# Patient Record
Sex: Male | Born: 1970
Health system: Southern US, Community
[De-identification: ages and names within clinical notes are randomized; demographics above are authoritative.]

## PROBLEM LIST (undated history)

## (undated) DIAGNOSIS — I2510 Atherosclerotic heart disease of native coronary artery without angina pectoris: Secondary | ICD-10-CM

## (undated) DIAGNOSIS — I10 Essential (primary) hypertension: Secondary | ICD-10-CM

## (undated) DIAGNOSIS — M549 Dorsalgia, unspecified: Secondary | ICD-10-CM

## (undated) DIAGNOSIS — I219 Acute myocardial infarction, unspecified: Secondary | ICD-10-CM

## (undated) DIAGNOSIS — M1990 Unspecified osteoarthritis, unspecified site: Secondary | ICD-10-CM

## (undated) HISTORY — DX: Atherosclerotic heart disease of native coronary artery without angina pectoris: I25.10

## (undated) HISTORY — PX: ELBOW SURGERY: SHX618

---

## 2002-11-24 ENCOUNTER — Emergency Department (HOSPITAL_COMMUNITY): Admission: AD | Admit: 2002-11-24 | Discharge: 2002-11-24 | Payer: Self-pay | Admitting: Family Medicine

## 2005-05-13 ENCOUNTER — Emergency Department (HOSPITAL_COMMUNITY): Admission: EM | Admit: 2005-05-13 | Discharge: 2005-05-13 | Payer: Self-pay | Admitting: Family Medicine

## 2005-09-25 ENCOUNTER — Emergency Department (HOSPITAL_COMMUNITY): Admission: EM | Admit: 2005-09-25 | Discharge: 2005-09-25 | Payer: Self-pay | Admitting: Family Medicine

## 2005-10-23 ENCOUNTER — Emergency Department (HOSPITAL_COMMUNITY): Admission: EM | Admit: 2005-10-23 | Discharge: 2005-10-23 | Payer: Self-pay | Admitting: Family Medicine

## 2006-01-28 ENCOUNTER — Emergency Department (HOSPITAL_COMMUNITY): Admission: EM | Admit: 2006-01-28 | Discharge: 2006-01-28 | Payer: Self-pay | Admitting: Family Medicine

## 2006-05-29 ENCOUNTER — Ambulatory Visit: Payer: Self-pay | Admitting: Internal Medicine

## 2006-05-29 DIAGNOSIS — K089 Disorder of teeth and supporting structures, unspecified: Secondary | ICD-10-CM | POA: Insufficient documentation

## 2007-03-04 ENCOUNTER — Encounter (INDEPENDENT_AMBULATORY_CARE_PROVIDER_SITE_OTHER): Payer: Self-pay | Admitting: Family Medicine

## 2008-02-20 ENCOUNTER — Emergency Department (HOSPITAL_COMMUNITY): Admission: EM | Admit: 2008-02-20 | Discharge: 2008-02-20 | Payer: Self-pay | Admitting: Family Medicine

## 2010-02-21 ENCOUNTER — Emergency Department (HOSPITAL_COMMUNITY): Payer: Self-pay

## 2010-02-21 ENCOUNTER — Emergency Department (HOSPITAL_COMMUNITY)
Admission: EM | Admit: 2010-02-21 | Discharge: 2010-02-21 | Disposition: A | Discharge status: Home / Self Care | Payer: Self-pay | Attending: Emergency Medicine | Admitting: Emergency Medicine

## 2010-02-21 DIAGNOSIS — K0510 Chronic gingivitis, plaque induced: Secondary | ICD-10-CM | POA: Insufficient documentation

## 2010-02-21 DIAGNOSIS — R509 Fever, unspecified: Secondary | ICD-10-CM | POA: Insufficient documentation

## 2010-02-21 DIAGNOSIS — R05 Cough: Secondary | ICD-10-CM | POA: Insufficient documentation

## 2010-02-21 DIAGNOSIS — F17200 Nicotine dependence, unspecified, uncomplicated: Secondary | ICD-10-CM | POA: Insufficient documentation

## 2010-02-21 DIAGNOSIS — J069 Acute upper respiratory infection, unspecified: Secondary | ICD-10-CM | POA: Insufficient documentation

## 2010-02-21 DIAGNOSIS — R059 Cough, unspecified: Secondary | ICD-10-CM | POA: Insufficient documentation

## 2010-02-21 DIAGNOSIS — K1370 Unspecified lesions of oral mucosa: Secondary | ICD-10-CM | POA: Insufficient documentation

## 2010-10-03 ENCOUNTER — Ambulatory Visit (INDEPENDENT_AMBULATORY_CARE_PROVIDER_SITE_OTHER): Payer: BC Managed Care – PPO

## 2010-10-03 ENCOUNTER — Inpatient Hospital Stay (INDEPENDENT_AMBULATORY_CARE_PROVIDER_SITE_OTHER)
Admission: RE | Admit: 2010-10-03 | Discharge: 2010-10-03 | Disposition: A | Discharge status: Home / Self Care | Payer: BC Managed Care – PPO | Source: Ambulatory Visit | Attending: Family Medicine | Admitting: Family Medicine

## 2010-10-03 DIAGNOSIS — J40 Bronchitis, not specified as acute or chronic: Secondary | ICD-10-CM

## 2010-10-03 DIAGNOSIS — J45909 Unspecified asthma, uncomplicated: Secondary | ICD-10-CM

## 2011-11-19 DIAGNOSIS — M549 Dorsalgia, unspecified: Secondary | ICD-10-CM

## 2011-11-19 HISTORY — DX: Dorsalgia, unspecified: M54.9

## 2011-11-27 ENCOUNTER — Emergency Department (HOSPITAL_BASED_OUTPATIENT_CLINIC_OR_DEPARTMENT_OTHER): Payer: Worker's Compensation

## 2011-11-27 ENCOUNTER — Emergency Department (HOSPITAL_BASED_OUTPATIENT_CLINIC_OR_DEPARTMENT_OTHER)
Admission: EM | Admit: 2011-11-27 | Discharge: 2011-11-27 | Disposition: A | Discharge status: Home / Self Care | Payer: Worker's Compensation | Attending: Emergency Medicine | Admitting: Emergency Medicine

## 2011-11-27 ENCOUNTER — Encounter (HOSPITAL_BASED_OUTPATIENT_CLINIC_OR_DEPARTMENT_OTHER): Payer: Self-pay | Admitting: Emergency Medicine

## 2011-11-27 DIAGNOSIS — W010XXA Fall on same level from slipping, tripping and stumbling without subsequent striking against object, initial encounter: Secondary | ICD-10-CM | POA: Insufficient documentation

## 2011-11-27 DIAGNOSIS — S8000XA Contusion of unspecified knee, initial encounter: Secondary | ICD-10-CM

## 2011-11-27 DIAGNOSIS — F17200 Nicotine dependence, unspecified, uncomplicated: Secondary | ICD-10-CM | POA: Insufficient documentation

## 2011-11-27 DIAGNOSIS — Y9289 Other specified places as the place of occurrence of the external cause: Secondary | ICD-10-CM | POA: Insufficient documentation

## 2011-11-27 DIAGNOSIS — Y9389 Activity, other specified: Secondary | ICD-10-CM | POA: Insufficient documentation

## 2011-11-27 NOTE — ED Notes (Signed)
Pt tripped over a box at work and fell onto a concrete floor onto left knee. Skin intact.  No swelling noted.  Pt ambulatory with limp.
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
## 2011-11-27 NOTE — ED Provider Notes (Signed)
History     CSN: [id number]  Arrival date & time 11/27/11  1919   First MD Initiated Contact with Patient 11/27/11 1942      Chief Complaint  Patient presents with  . Knee Injury  . Fall    (Consider location/radiation/quality/duration/timing/severity/associated sxs/prior treatment) HPI Pt reports he was at work today when he tripped over a box and landed on his L knee, hitting the medial side of his knee on a concrete floor. Complaining of moderate aching pain to knee, worse with movement. No other injuries.   History reviewed. No pertinent past medical history.  Past Surgical History  Procedure Date  . Elbow surgery     No family history on file.  History  Substance Use Topics  . Smoking status: Heavy Tobacco Smoker -- 2.5 packs/day    Types: Cigarettes  . Smokeless tobacco: Not on file  . Alcohol Use: No      Review of Systems All other systems reviewed and are negative except as noted in HPI.   Allergies  Review of patient's allergies indicates no known allergies.  Home Medications  No current outpatient prescriptions on file.  BP 132/86  Pulse 71  Temp 98.5 F (36.9 C) (Oral)  Resp 18  Ht 5\' 7"  (1.702 m)  Wt 137 lb 9 oz (62.398 kg)  BMI 21.55 kg/m2  SpO2 99%  Physical Exam  Nursing note and vitals reviewed. Constitutional: He is oriented to person, place, and time. He appears well-developed and well-nourished.  HENT:  Head: Atraumatic.  Neck: Neck supple.  Cardiovascular: Intact distal pulses.   Pulmonary/Chest: Effort normal.  Musculoskeletal: He exhibits tenderness (tender to medial L knee without joint effusion, ROM limited by pain). He exhibits no edema.  Neurological: He is alert and oriented to person, place, and time. He has normal strength. No cranial nerve deficit or sensory deficit.  Skin: Skin is warm and dry. No rash noted.  Psychiatric: He has a normal mood and affect.    ED Course  Procedures (including critical care  time)  Labs Reviewed - No data to display Dg Knee Complete 4 Views Left  11/27/2011  *RADIOLOGY REPORT*  Clinical Data: Pain post fall.  LEFT KNEE - COMPLETE 4+ VIEW  Comparison: None.  Findings: No effusion. Negative for fracture, dislocation, or other acute abnormality.  Normal alignment and mineralization. No significant degenerative change.  Regional soft tissues unremarkable.  IMPRESSION:  Negative   Original Report Authenticated By: D. Andria Rhein, MD      No diagnosis found.    MDM  Xray neg. Given ACE wrap, advised NSAIDs as needed for pain.        Charles B. Bernette Mayers, MD 11/27/11 2034

## 2011-11-28 ENCOUNTER — Emergency Department (HOSPITAL_BASED_OUTPATIENT_CLINIC_OR_DEPARTMENT_OTHER): Payer: Worker's Compensation

## 2011-11-28 ENCOUNTER — Emergency Department (HOSPITAL_BASED_OUTPATIENT_CLINIC_OR_DEPARTMENT_OTHER)
Admission: EM | Admit: 2011-11-28 | Discharge: 2011-11-28 | Disposition: A | Discharge status: Home / Self Care | Payer: Worker's Compensation | Attending: Emergency Medicine | Admitting: Emergency Medicine

## 2011-11-28 ENCOUNTER — Encounter (HOSPITAL_BASED_OUTPATIENT_CLINIC_OR_DEPARTMENT_OTHER): Payer: Self-pay | Admitting: *Deleted

## 2011-11-28 DIAGNOSIS — M545 Low back pain, unspecified: Secondary | ICD-10-CM | POA: Insufficient documentation

## 2011-11-28 DIAGNOSIS — M549 Dorsalgia, unspecified: Secondary | ICD-10-CM

## 2011-11-28 DIAGNOSIS — F17200 Nicotine dependence, unspecified, uncomplicated: Secondary | ICD-10-CM | POA: Insufficient documentation

## 2011-11-28 MED ORDER — HYDROCODONE-ACETAMINOPHEN 5-325 MG PO TABS: 2.0000 | ORAL_TABLET | ORAL | Status: DC | PRN

## 2011-11-28 MED ORDER — HYDROCODONE-ACETAMINOPHEN 5-325 MG PO TABS
2.0000 | ORAL_TABLET | Freq: Once | ORAL | Status: AC
  Administered 2011-11-28: 2 via ORAL
  Filled 2011-11-28: qty 2

## 2011-11-28 NOTE — ED Notes (Signed)
Back pain. Was here yesterday for same.
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
## 2011-11-28 NOTE — ED Provider Notes (Signed)
Medical screening examination/treatment/procedure(s) were performed by non-physician practitioner and as supervising physician I was immediately available for consultation/collaboration.  Ethelda Chick, MD 11/28/11 [phone]

## 2011-11-28 NOTE — ED Provider Notes (Signed)
History     CSN: [id number]  Arrival date & time 11/28/11  1607   First MD Initiated Contact with Patient 11/28/11 1730      Chief Complaint  Patient presents with  . Back Pain    (Consider location/radiation/quality/duration/timing/severity/associated sxs/prior treatment) Patient is a 41 y.o. male presenting with back pain. The history is provided by the patient. No language interpreter was used.  Back Pain  This is a new problem. The current episode started more than 1 week ago. The problem occurs constantly. The pain is associated with falling. The pain is present in the lumbar spine. The quality of the pain is described as shooting and aching. The pain does not radiate. The pain is at a severity of 5/10. The pain is moderate. The symptoms are aggravated by bending and twisting. The pain is the same all the time. Stiffness is present all day. He has tried nothing for the symptoms. The treatment provided no relief.  Pt complains of pain to his low back.  Pt fell yesterday and was seen here for pain to his knee from fall.  Pt reports back was sore from hitting the concreted and has worsened today  History reviewed. No pertinent past medical history.  Past Surgical History  Procedure Date  . Elbow surgery     No family history on file.  History  Substance Use Topics  . Smoking status: Heavy Tobacco Smoker -- 2.5 packs/day    Types: Cigarettes  . Smokeless tobacco: Not on file  . Alcohol Use: No      Review of Systems  Musculoskeletal: Positive for back pain.  All other systems reviewed and are negative.    Allergies  Review of patient's allergies indicates no known allergies.  Home Medications  No current outpatient prescriptions on file.  BP 142/86  Pulse 99  Temp 98.3 F (36.8 C) (Oral)  Resp 20  SpO2 99%  Physical Exam  Nursing note and vitals reviewed. Constitutional: He is oriented to person, place, and time. He appears well-developed and  well-nourished.  HENT:  Head: Normocephalic and atraumatic.  Right Ear: External ear normal.  Left Ear: External ear normal.  Eyes: Conjunctivae normal and EOM are normal. Pupils are equal, round, and reactive to light.  Neck: Normal range of motion. Neck supple.  Cardiovascular: Normal rate and regular rhythm.   Pulmonary/Chest: Effort normal and breath sounds normal.  Abdominal: Soft. Bowel sounds are normal.  Musculoskeletal: He exhibits tenderness.  Neurological: He is alert and oriented to person, place, and time. He has normal reflexes.  Skin: Skin is warm.  Psychiatric: He has a normal mood and affect.    ED Course  Procedures (including critical care time)  Labs Reviewed - No data to display Dg Knee Complete 4 Views Left  11/27/2011  *RADIOLOGY REPORT*  Clinical Data: Pain post fall.  LEFT KNEE - COMPLETE 4+ VIEW  Comparison: None.  Findings: No effusion. Negative for fracture, dislocation, or other acute abnormality.  Normal alignment and mineralization. No significant degenerative change.  Regional soft tissues unremarkable.  IMPRESSION:  Negative   Original Report Authenticated By: D. Andria Rhein, MD      No diagnosis found.    MDM  Pt given rx for hydrocodone for pain.   I advised pt to follow up with Dr. Pearletha Forge for recheck       Elson Areas, [place] 11/28/11 1859

## 2018-06-21 DIAGNOSIS — M5416 Radiculopathy, lumbar region: Secondary | ICD-10-CM

## 2018-06-21 HISTORY — DX: Radiculopathy, lumbar region: M54.16

## 2018-06-25 ENCOUNTER — Ambulatory Visit (INDEPENDENT_AMBULATORY_CARE_PROVIDER_SITE_OTHER): Payer: BC Managed Care – PPO

## 2018-06-25 ENCOUNTER — Encounter (HOSPITAL_COMMUNITY): Payer: Self-pay | Admitting: Emergency Medicine

## 2018-06-25 ENCOUNTER — Other Ambulatory Visit: Payer: Self-pay

## 2018-06-25 ENCOUNTER — Ambulatory Visit (HOSPITAL_COMMUNITY)
Admission: EM | Admit: 2018-06-25 | Discharge: 2018-06-25 | Disposition: A | Discharge status: Home / Self Care | Payer: BC Managed Care – PPO | Attending: Family Medicine | Admitting: Family Medicine

## 2018-06-25 DIAGNOSIS — S2241XA Multiple fractures of ribs, right side, initial encounter for closed fracture: Secondary | ICD-10-CM | POA: Diagnosis not present

## 2018-06-25 MED ORDER — IBUPROFEN 800 MG PO TABS: 800.0000 mg | ORAL_TABLET | Freq: Three times a day (TID) | ORAL | 0 refills | Status: DC | PRN

## 2018-06-25 MED ORDER — HYDROCODONE-ACETAMINOPHEN 5-325 MG PO TABS: 1.0000 | ORAL_TABLET | Freq: Four times a day (QID) | ORAL | 0 refills | Status: DC | PRN

## 2018-06-25 NOTE — Discharge Instructions (Addendum)
You have 2 broken ribs Take ibuprofen 3 times a day with food Take hydrocodone if needed for severe pain Do not take hydrocodone and drive, or work You need to avoid heavy lifting pushing and pulling until ribs have healed.  Ideally, light duty for another 6 to 8 weeks Try to reduce or quit smoking Take deep breaths and cough several times a day to prevent sputum from building up in your chest Watch for pneumonia.  If you develop increasing cough, fever, body aches, fatigue, or shortness of breath you need to come back for a recheck, or present to the emergency room for care
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
## 2019-06-21 DIAGNOSIS — M25552 Pain in left hip: Secondary | ICD-10-CM | POA: Diagnosis not present

## 2019-06-21 DIAGNOSIS — M5416 Radiculopathy, lumbar region: Secondary | ICD-10-CM | POA: Diagnosis not present

## 2019-06-21 DIAGNOSIS — M25551 Pain in right hip: Secondary | ICD-10-CM | POA: Diagnosis not present

## 2019-06-21 DIAGNOSIS — M545 Low back pain: Secondary | ICD-10-CM | POA: Diagnosis not present

## 2019-06-21 DIAGNOSIS — S3992XA Unspecified injury of lower back, initial encounter: Secondary | ICD-10-CM | POA: Diagnosis not present

## 2019-06-21 DIAGNOSIS — F17200 Nicotine dependence, unspecified, uncomplicated: Secondary | ICD-10-CM | POA: Diagnosis not present

## 2019-06-21 DIAGNOSIS — M5432 Sciatica, left side: Secondary | ICD-10-CM | POA: Diagnosis not present

## 2019-06-30 DIAGNOSIS — M5442 Lumbago with sciatica, left side: Secondary | ICD-10-CM | POA: Diagnosis not present

## 2019-06-30 DIAGNOSIS — I10 Essential (primary) hypertension: Secondary | ICD-10-CM | POA: Diagnosis not present

## 2019-09-17 DIAGNOSIS — M5442 Lumbago with sciatica, left side: Secondary | ICD-10-CM | POA: Diagnosis not present

## 2019-09-17 DIAGNOSIS — M21612 Bunion of left foot: Secondary | ICD-10-CM | POA: Diagnosis not present

## 2019-10-11 ENCOUNTER — Ambulatory Visit: Payer: Self-pay

## 2019-10-11 ENCOUNTER — Ambulatory Visit (INDEPENDENT_AMBULATORY_CARE_PROVIDER_SITE_OTHER): Payer: Self-pay | Admitting: Physician Assistant

## 2019-10-11 ENCOUNTER — Encounter: Payer: Self-pay | Admitting: Orthopedic Surgery

## 2019-10-11 DIAGNOSIS — M79672 Pain in left foot: Secondary | ICD-10-CM

## 2019-10-11 DIAGNOSIS — M5416 Radiculopathy, lumbar region: Secondary | ICD-10-CM

## 2019-10-11 DIAGNOSIS — M545 Low back pain, unspecified: Secondary | ICD-10-CM

## 2019-10-11 HISTORY — DX: Radiculopathy, lumbar region: M54.16

## 2019-10-11 MED ORDER — PREDNISONE 10 MG PO TABS: 20.0000 mg | ORAL_TABLET | Freq: Every day | ORAL | 0 refills | Status: DC
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
## 2019-11-26 ENCOUNTER — Other Ambulatory Visit: Payer: Self-pay

## 2019-11-26 ENCOUNTER — Emergency Department (HOSPITAL_COMMUNITY): Payer: Self-pay

## 2019-11-26 ENCOUNTER — Emergency Department (HOSPITAL_COMMUNITY)
Admission: EM | Admit: 2019-11-26 | Discharge: 2019-11-26 | Disposition: A | Discharge status: Home / Self Care | Payer: Self-pay | Attending: Emergency Medicine | Admitting: Emergency Medicine

## 2019-11-26 ENCOUNTER — Encounter (HOSPITAL_COMMUNITY): Payer: Self-pay | Admitting: Emergency Medicine

## 2019-11-26 DIAGNOSIS — F17210 Nicotine dependence, cigarettes, uncomplicated: Secondary | ICD-10-CM | POA: Insufficient documentation

## 2019-11-26 DIAGNOSIS — M5441 Lumbago with sciatica, right side: Secondary | ICD-10-CM | POA: Insufficient documentation

## 2019-11-26 DIAGNOSIS — M1990 Unspecified osteoarthritis, unspecified site: Secondary | ICD-10-CM

## 2019-11-26 DIAGNOSIS — G8929 Other chronic pain: Secondary | ICD-10-CM | POA: Insufficient documentation

## 2019-11-26 DIAGNOSIS — I10 Essential (primary) hypertension: Secondary | ICD-10-CM

## 2019-11-26 HISTORY — DX: Essential (primary) hypertension: I10

## 2019-11-26 HISTORY — DX: Unspecified osteoarthritis, unspecified site: M19.90

## 2019-11-26 MED ORDER — METHYLPREDNISOLONE 4 MG PO TBPK: ORAL_TABLET | ORAL | 0 refills | Status: DC

## 2019-11-26 NOTE — Discharge Instructions (Addendum)
Please return for any problem.   Follow up with [facility] as instructed.   [facility] - Medical Records [phone].
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
## 2020-05-01 ENCOUNTER — Other Ambulatory Visit: Payer: Self-pay

## 2020-12-19 ENCOUNTER — Ambulatory Visit (HOSPITAL_COMMUNITY)
Admission: EM | Admit: 2020-12-19 | Discharge: 2020-12-19 | Disposition: A | Discharge status: Home / Self Care | Payer: Self-pay | Attending: Physician Assistant | Admitting: Physician Assistant

## 2020-12-19 ENCOUNTER — Encounter (HOSPITAL_COMMUNITY): Payer: Self-pay | Admitting: Emergency Medicine

## 2020-12-19 ENCOUNTER — Other Ambulatory Visit: Payer: Self-pay

## 2020-12-19 DIAGNOSIS — J069 Acute upper respiratory infection, unspecified: Secondary | ICD-10-CM

## 2020-12-19 DIAGNOSIS — R52 Pain, unspecified: Secondary | ICD-10-CM | POA: Insufficient documentation

## 2020-12-19 DIAGNOSIS — R051 Acute cough: Secondary | ICD-10-CM | POA: Insufficient documentation

## 2020-12-19 DIAGNOSIS — Z20822 Contact with and (suspected) exposure to covid-19: Secondary | ICD-10-CM | POA: Insufficient documentation

## 2020-12-19 DIAGNOSIS — R5383 Other fatigue: Secondary | ICD-10-CM | POA: Insufficient documentation

## 2020-12-19 DIAGNOSIS — F17210 Nicotine dependence, cigarettes, uncomplicated: Secondary | ICD-10-CM | POA: Insufficient documentation

## 2020-12-19 LAB — POC INFLUENZA A AND B ANTIGEN (URGENT CARE ONLY)
INFLUENZA A ANTIGEN, POC: NEGATIVE
INFLUENZA B ANTIGEN, POC: NEGATIVE

## 2020-12-19 MED ORDER — PROMETHAZINE-DM 6.25-15 MG/5ML PO SYRP: 5.0000 mL | ORAL_SOLUTION | Freq: Three times a day (TID) | ORAL | 0 refills | Status: DC | PRN

## 2020-12-19 NOTE — ED Provider Notes (Signed)
[facility]    CSN: [id number] Arrival date & time: 12/19/20  0944      History   Chief Complaint Chief Complaint  Patient presents with   Headache   Fatigue   Generalized Body Aches    HPI SENDER RUEB is a 50 y.o. male.   Patient presents today with a 2-day history of URI symptoms including change in sense of taste, headache, fever, body aches, cough, congestion.  Denies any chest pain, shortness of breath, nausea, vomiting, diarrhea.  Reports sick contacts at work that have been diagnosed with COVID-19 as well as flu.  He has had COVID-19 vaccine Linwood Dibbles) but has not had booster.  He has not had flu shot.  He denies any significant past medical history including asthma, allergies, COPD but is a current everyday smoker.  He denies any formal diagnosis of diabetes but does have a family history of this and is interested in being tested at; does not currently have a PCP but is open to establishing with one.  He is having difficulty with daily activities as result of symptoms.   History reviewed. No pertinent past medical history.  Patient Active Problem List   Diagnosis Date Noted   DENTAL PAIN 05/29/2006    Past Surgical History:  Procedure Laterality Date   ELBOW SURGERY         Home Medications    Prior to Admission medications   Medication Sig Start Date End Date Taking? Authorizing Provider  promethazine-dextromethorphan (PROMETHAZINE-DM) 6.25-15 MG/5ML syrup Take 5 mLs by mouth 3 (three) times daily as needed for cough. 12/19/20  Yes Hopie Pellegrin, Noberto Retort, PA-C    Family History History reviewed. No pertinent family history.  Social History Social History   Tobacco Use   Smoking status: Heavy Smoker    Packs/day: 2.50    Types: Cigarettes   Smokeless tobacco: Never  Substance Use Topics   Alcohol use: No   Drug use: No     Allergies   Patient has no known allergies.   Review of Systems Review of Systems  Constitutional:  Positive for  activity change and fatigue. Negative for appetite change and fever.  HENT:  Positive for congestion. Negative for sinus pressure, sneezing and sore throat.   Respiratory:  Positive for cough. Negative for shortness of breath.   Cardiovascular:  Negative for chest pain.  Gastrointestinal:  Negative for abdominal pain, diarrhea, nausea and vomiting.  Musculoskeletal:  Positive for arthralgias and myalgias.  Neurological:  Positive for headaches. Negative for dizziness and light-headedness.    Physical Exam Triage Vital Signs ED Triage Vitals  Enc Vitals Group     BP 12/19/20 1118 (!) 155/90     Pulse Rate 12/19/20 1118 69     Resp 12/19/20 1118 17     Temp 12/19/20 1118 98.3 F (36.8 C)     Temp Source 12/19/20 1118 Oral     SpO2 12/19/20 1118 97 %     Weight --      Height --      Head Circumference --      Peak Flow --      Pain Score 12/19/20 1117 5     Pain Loc --      Pain Edu? --      Excl. in GC? --    No data found.  Updated Vital Signs BP (!) 155/90    Pulse 69    Temp 98.3 F (36.8 C) (Oral)  Resp 17    SpO2 97%   Visual Acuity Right Eye Distance:   Left Eye Distance:   Bilateral Distance:    Right Eye Near:   Left Eye Near:    Bilateral Near:     Physical Exam Vitals reviewed.  Constitutional:      General: He is awake.     Appearance: Normal appearance. He is well-developed. He is not ill-appearing.     Comments: Very pleasant male appears stated age in no acute distress sitting comfortably in exam room  HENT:     Head: Normocephalic and atraumatic.     Right Ear: Tympanic membrane, ear canal and external ear normal. Tympanic membrane is not erythematous or bulging.     Left Ear: Tympanic membrane, ear canal and external ear normal. Tympanic membrane is not erythematous or bulging.     Nose: Nose normal.     Mouth/Throat:     Pharynx: Uvula midline. Posterior oropharyngeal erythema present. No oropharyngeal exudate or uvula swelling.   Cardiovascular:     Rate and Rhythm: Normal rate and regular rhythm.     Heart sounds: Normal heart sounds, S1 normal and S2 normal. No murmur heard. Pulmonary:     Effort: Pulmonary effort is normal. No accessory muscle usage or respiratory distress.     Breath sounds: Normal breath sounds. No stridor. No wheezing, rhonchi or rales.     Comments: Clear to auscultation bilaterally Abdominal:     General: Bowel sounds are normal.     Palpations: Abdomen is soft.     Tenderness: There is no abdominal tenderness.  Neurological:     Mental Status: He is alert.  Psychiatric:        Behavior: Behavior is cooperative.     UC Treatments / Results  Labs (all labs ordered are listed, but only abnormal results are displayed) Labs Reviewed  SARS CORONAVIRUS 2 (TAT 6-24 HRS)  POC INFLUENZA A AND B ANTIGEN (URGENT CARE ONLY)    EKG   Radiology No results found.  Procedures Procedures (including critical care time)  Medications Ordered in UC Medications - No data to display  Initial Impression / Assessment and Plan / UC Course  I have reviewed the triage vital signs and the nursing notes.  Pertinent labs & imaging results that were available during my care of the patient were reviewed by me and considered in my medical decision making (see chart for details).     Discussed likely viral etiology given short duration of symptoms.  Patient is negative for influenza.  COVID test is pending.  He was provided work excuse note with current CDC return to work guidelines based on COVID test results.  No evidence of acute infection that warrant initiation of antibiotics.  He was prescribed Promethazine DM for cough with instruction of drive drink alcohol with taking this medication.  Recommended he use over-the-counter medications including Tylenol and ibuprofen for fever and pain and Mucinex/Flonase for cough and congestion.  He is to rest and drink plenty of fluid.  We will try to establish  him with a primary care provider for follow-up of acute illness as well as chronic medical conditions and preventative health.  Discussed alarm symptoms that warrant emergent evaluation.  Strict return precautions given to which he expressed understanding.  Final Clinical Impressions(s) / UC Diagnoses   Final diagnoses:  Upper respiratory tract infection, unspecified type  Acute cough     Discharge Instructions      I believe  they have a virus.  You tested negative for flu.  We will contact you if your COVID test result is positive.  Please remain in isolation until you receive these results.  Use Promethazine DM up to 3 times a day as needed for cough.  This can make you sleepy do not drive or drink alcohol with taking it.  Alternate Tylenol and ibuprofen for fever and pain.  Use Mucinex and Flonase for cough and congestion.  Make sure you rest and drink plenty of fluid.  If you have any worsening symptoms including high fever, shortness of breath, worsening cough, nausea, vomiting you need to be seen immediately.     ED Prescriptions     Medication Sig Dispense Auth. Provider   promethazine-dextromethorphan (PROMETHAZINE-DM) 6.25-15 MG/5ML syrup Take 5 mLs by mouth 3 (three) times daily as needed for cough. 118 mL Ashton Belote K, PA-C      PDMP not reviewed this encounter.   Jeani Hawking, PA-C 12/19/20 1252

## 2020-12-19 NOTE — ED Triage Notes (Signed)
Pt is present today with loss of taste, HA, fatigue, and body aches. Pt states sx started Sunday

## 2020-12-19 NOTE — Discharge Instructions (Signed)
I believe they have a virus.  You tested negative for flu.  We will contact you if your COVID test result is positive.  Please remain in isolation until you receive these results.  Use Promethazine DM up to 3 times a day as needed for cough.  This can make you sleepy do not drive or drink alcohol with taking it.  Alternate Tylenol and ibuprofen for fever and pain.  Use Mucinex and Flonase for cough and congestion.  Make sure you rest and drink plenty of fluid.  If you have any worsening symptoms including high fever, shortness of breath, worsening cough, nausea, vomiting you need to be seen immediately.

## 2020-12-20 LAB — SARS CORONAVIRUS 2 (TAT 6-24 HRS): SARS Coronavirus 2: NEGATIVE

## 2021-02-08 ENCOUNTER — Ambulatory Visit (HOSPITAL_COMMUNITY)
Admission: EM | Admit: 2021-02-08 | Discharge: 2021-02-08 | Disposition: A | Discharge status: Home / Self Care | Payer: Self-pay | Attending: Student | Admitting: Student

## 2021-02-08 ENCOUNTER — Encounter (HOSPITAL_COMMUNITY): Payer: Self-pay

## 2021-02-08 ENCOUNTER — Other Ambulatory Visit: Payer: Self-pay

## 2021-02-08 DIAGNOSIS — S46811A Strain of other muscles, fascia and tendons at shoulder and upper arm level, right arm, initial encounter: Secondary | ICD-10-CM

## 2021-02-08 MED ORDER — TIZANIDINE HCL 2 MG PO TABS: 2.0000 mg | ORAL_TABLET | Freq: Three times a day (TID) | ORAL | 0 refills | Status: DC | PRN
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
## 2021-02-08 NOTE — Discharge Instructions (Addendum)
-  Start the muscle relaxer-Zanaflex (tizanidine), up to 3 times daily for muscle spasms and pain.  This can make you drowsy, so take at bedtime or when you do not need to drive or operate machinery. -Heating pad  
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
## 2021-02-20 ENCOUNTER — Ambulatory Visit (INDEPENDENT_AMBULATORY_CARE_PROVIDER_SITE_OTHER): Payer: Self-pay | Admitting: Orthopaedic Surgery

## 2021-02-20 ENCOUNTER — Other Ambulatory Visit: Payer: Self-pay

## 2021-02-20 ENCOUNTER — Encounter: Payer: Self-pay | Admitting: Orthopaedic Surgery

## 2021-02-20 ENCOUNTER — Ambulatory Visit (INDEPENDENT_AMBULATORY_CARE_PROVIDER_SITE_OTHER): Payer: Self-pay

## 2021-02-20 VITALS — Ht 67.0 in | Wt 160.0 lb

## 2021-02-20 DIAGNOSIS — G8929 Other chronic pain: Secondary | ICD-10-CM

## 2021-02-20 DIAGNOSIS — M545 Low back pain, unspecified: Secondary | ICD-10-CM

## 2021-02-20 MED ORDER — PREDNISONE 10 MG (21) PO TBPK: ORAL_TABLET | ORAL | 21 refills | Status: DC

## 2021-02-20 MED ORDER — DICLOFENAC SODIUM 75 MG PO TBEC: 75.0000 mg | DELAYED_RELEASE_TABLET | Freq: Two times a day (BID) | ORAL | 2 refills | Status: DC | PRN
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
## 2021-02-21 ENCOUNTER — Telehealth: Payer: Self-pay | Admitting: Physical Medicine and Rehabilitation

## 2021-02-21 NOTE — Telephone Encounter (Signed)
Patient called needing a call back to schedule an appointment with Dr. Alvester Morin for his back. 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 He will have to let his job know his appointment so he can take off work.   [phone]

## 2021-03-01 ENCOUNTER — Telehealth: Payer: Self-pay | Admitting: Physical Medicine and Rehabilitation
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
## 2021-03-01 NOTE — Telephone Encounter (Signed)
Patient called. Would like an appointment with Dr. Newton  

## 2021-03-02 ENCOUNTER — Ambulatory Visit: Payer: Self-pay | Attending: Physician Assistant

## 2021-03-02 ENCOUNTER — Other Ambulatory Visit: Payer: Self-pay

## 2021-03-02 DIAGNOSIS — G8929 Other chronic pain: Secondary | ICD-10-CM | POA: Insufficient documentation

## 2021-03-02 DIAGNOSIS — M545 Low back pain, unspecified: Secondary | ICD-10-CM | POA: Insufficient documentation

## 2021-03-09 ENCOUNTER — Other Ambulatory Visit: Payer: Self-pay

## 2021-03-09 ENCOUNTER — Ambulatory Visit: Payer: BC Managed Care – PPO | Attending: Physician Assistant

## 2021-03-09 DIAGNOSIS — G8929 Other chronic pain: Secondary | ICD-10-CM | POA: Diagnosis present

## 2021-03-09 DIAGNOSIS — M545 Low back pain, unspecified: Secondary | ICD-10-CM | POA: Insufficient documentation
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
## 2021-03-12 ENCOUNTER — Telehealth: Payer: Self-pay | Admitting: Physical Medicine and Rehabilitation

## 2021-03-12 NOTE — Telephone Encounter (Signed)
Pt returned call to [facility] to set an appt. Please call pt at [phone] ?

## 2021-03-23 ENCOUNTER — Ambulatory Visit: Payer: BC Managed Care – PPO

## 2021-03-23 ENCOUNTER — Other Ambulatory Visit: Payer: Self-pay

## 2021-03-23 DIAGNOSIS — M545 Low back pain, unspecified: Secondary | ICD-10-CM
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
## 2021-03-27 ENCOUNTER — Ambulatory Visit (INDEPENDENT_AMBULATORY_CARE_PROVIDER_SITE_OTHER): Payer: BC Managed Care – PPO | Admitting: Physical Medicine and Rehabilitation

## 2021-03-27 ENCOUNTER — Ambulatory Visit: Payer: Self-pay

## 2021-03-27 ENCOUNTER — Encounter: Payer: Self-pay | Admitting: Physical Medicine and Rehabilitation

## 2021-03-27 ENCOUNTER — Other Ambulatory Visit: Payer: Self-pay

## 2021-03-27 VITALS — BP 138/80 | HR 71

## 2021-03-27 DIAGNOSIS — M5416 Radiculopathy, lumbar region: Secondary | ICD-10-CM

## 2021-03-27 MED ORDER — METHYLPREDNISOLONE ACETATE 80 MG/ML IJ SUSP
80.0000 mg | Freq: Once | INTRAMUSCULAR | Status: AC
  Administered 2021-03-27: 80 mg

## 2021-03-27 NOTE — Progress Notes (Signed)
Patient presents today for L5/S1 interlam ESI. He states that pain is 0/10 however he is still having continued tingling pains in his feet. At this time OTC tylenol is being used for pain prn. ?

## 2021-03-27 NOTE — Patient Instructions (Signed)

## 2021-03-30 ENCOUNTER — Ambulatory Visit: Payer: BC Managed Care – PPO

## 2021-03-30 ENCOUNTER — Other Ambulatory Visit: Payer: Self-pay

## 2021-03-30 DIAGNOSIS — M545 Low back pain, unspecified: Secondary | ICD-10-CM | POA: Diagnosis not present
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
## 2021-04-06 NOTE — Procedures (Signed)
Lumbar Epidural Steroid Injection - Interlaminar Approach with Fluoroscopic Guidance ? ?[name]      ?Date of Birth: September 04, 1970 ?MRN: [id number] ?PCP: Patient, No Pcp Per (Inactive)      ?Visit Date: 03/27/2021 ?  ?Universal Protocol:    ? ?Consent Given By: the patient ? ?Position: PRONE ? ?Additional Comments: ?Vital signs were monitored before and after the procedure. ?Patient was prepped and draped in the usual sterile fashion. ?The correct patient, procedure, and site was verified. ? ? ?Injection Procedure Details:  ? ?Procedure diagnoses: Lumbar radiculopathy [M54.16]  ? ?Meds Administered:  ?Meds ordered this encounter  ?Medications  ? methylPREDNISolone acetate (DEPO-MEDROL) injection 80 mg  ?  ? ?Laterality: Left ? ?Location/Site:  L5-S1 ? ?Needle: 3.5 in., 20 ga. Tuohy ? ?Needle Placement: Paramedian epidural ? ?Findings:  ? -Comments: Excellent flow of contrast into the epidural space. ? ?Procedure Details: ?Using a paramedian approach from the side mentioned above, the region overlying the inferior lamina was localized under fluoroscopic visualization and the soft tissues overlying this structure were infiltrated with 4 ml. of 1% Lidocaine without Epinephrine. The Tuohy needle was inserted into the epidural space using a paramedian approach.  ? ?The epidural space was localized using loss of resistance along with counter oblique bi-planar fluoroscopic views.  After negative aspirate for air, blood, and CSF, a 2 ml. volume of Isovue-250 was injected into the epidural space and the flow of contrast was observed. Radiographs were obtained for documentation purposes.   ? ?The injectate was administered into the level noted above. ? ? ?Additional Comments:  ?The patient tolerated the procedure well ?Dressing: 2 x 2 sterile gauze and Band-Aid ?  ? ?Post-procedure details: ?Patient was observed during the procedure. ?Post-procedure instructions were reviewed. ? ?Patient left the clinic in stable  condition. ?

## 2021-04-06 NOTE — Progress Notes (Signed)
? ?Wayne Huff - 51 y.o. male MRN [id number]  Date of birth: February 12, 1970 ? ?Office Visit Note: ?Visit Date: 03/27/2021 ?PCP: Patient, No Pcp Per (Inactive) ?Referred by: Tarry Kos, MD ? ?Subjective: ?Chief Complaint  ?Patient presents with  ? Lower Back - Follow-up  ? ?HPI:  Wayne Huff is a 51 y.o. male who comes in today at the request of Dr. Glee Arvin for planned Left L5-S1 Lumbar Interlaminar epidural steroid injection with fluoroscopic guidance.  The patient has failed conservative care including home exercise, medications, time and activity modification.  This injection will be diagnostic and hopefully therapeutic.  Please see requesting physician notes for further details and justification. ? ?ROS Otherwise per HPI. ? ?Assessment & Plan: ?Visit Diagnoses:  ?  ICD-10-CM   ?1. Lumbar radiculopathy  M54.16 XR C-ARM NO REPORT  ?  Epidural Steroid injection  ?  methylPREDNISolone acetate (DEPO-MEDROL) injection 80 mg  ?  ?  ?Plan: No additional findings.  ? ?Meds & Orders:  ?Meds ordered this encounter  ?Medications  ? methylPREDNISolone acetate (DEPO-MEDROL) injection 80 mg  ?  ?Orders Placed This Encounter  ?Procedures  ? XR C-ARM NO REPORT  ? Epidural Steroid injection  ?  ?Follow-up: Return for visit to requesting provider as needed.  ? ?Procedures: ?No procedures performed  ?Lumbar Epidural Steroid Injection - Interlaminar Approach with Fluoroscopic Guidance ? ?[name]      ?Date of Birth: [birth year] ?MRN: [id number] ?PCP: Patient, No Pcp Per (Inactive)      ?Visit Date: 03/27/2021 ?  ?Universal Protocol:    ? ?Consent Given By: the patient ? ?Position: PRONE ? ?Additional Comments: ?Vital signs were monitored before and after the procedure. ?Patient was prepped and draped in the usual sterile fashion. ?The correct patient, procedure, and site was verified. ? ? ?Injection Procedure Details:  ? ?Procedure diagnoses: Lumbar radiculopathy [M54.16]  ? ?Meds Administered:  ?Meds ordered this  encounter  ?Medications  ? methylPREDNISolone acetate (DEPO-MEDROL) injection 80 mg  ?  ? ?Laterality: Left ? ?Location/Site:  L5-S1 ? ?Needle: 3.5 in., 20 ga. Tuohy ? ?Needle Placement: Paramedian epidural ? ?Findings:  ? -Comments: Excellent flow of contrast into the epidural space. ? ?Procedure Details: ?Using a paramedian approach from the side mentioned above, the region overlying the inferior lamina was localized under fluoroscopic visualization and the soft tissues overlying this structure were infiltrated with 4 ml. of 1% Lidocaine without Epinephrine. The Tuohy needle was inserted into the epidural space using a paramedian approach.  ? ?The epidural space was localized using loss of resistance along with counter oblique bi-planar fluoroscopic views.  After negative aspirate for air, blood, and CSF, a 2 ml. volume of Isovue-250 was injected into the epidural space and the flow of contrast was observed. Radiographs were obtained for documentation purposes.   ? ?The injectate was administered into the level noted above. ? ? ?Additional Comments:  ?The patient tolerated the procedure well ?Dressing: 2 x 2 sterile gauze and Band-Aid ?  ? ?Post-procedure details: ?Patient was observed during the procedure. ?Post-procedure instructions were reviewed. ? ?Patient left the clinic in stable condition.  ? ?Clinical History: ?MRI LUMBAR SPINE WITHOUT CONTRAST  ?   ?TECHNIQUE:  ?Multiplanar, multisequence MR imaging of the lumbar spine was  ?performed. No intravenous contrast was administered.  ?   ?COMPARISON:  Lumbar spine radiographs 10/11/2019  ?   ?FINDINGS:  ?Segmentation:  Standard.  ?   ?Alignment:  Normal.  ?   ?  Vertebrae: No acute fracture, suspicious osseous lesion, or  ?significant marrow edema. Possible left-sided L5 pars defect without  ?significant listhesis.  ?   ?Conus medullaris and cauda equina: Conus extends to the T12-L1  ?level. Conus and cauda equina appear normal.  ?   ?Paraspinal and other soft  tissues: Unremarkable.  ?   ?Disc levels:  ?   ?T12-L1 and L1-2: Negative.  ?   ?L2-3: Minimal disc bulging and mild facet and ligamentum flavum  ?hypertrophy without stenosis.  ?   ?L3-4: Minimal disc bulging and mild facet and ligamentum flavum  ?hypertrophy without stenosis.  ?   ?L4-5: Disc desiccation and slight disc space narrowing. Mild disc  ?bulging, a small right foraminal disc protrusion, moderate  ?ligamentum flavum hypertrophy, and moderate to severe right and mild  ?left facet hypertrophy result in mild spinal stenosis and mild right  ?neural foraminal stenosis.  ?   ?L5-S1: Minimal disc bulging and mild facet hypertrophy without  ?stenosis.  ?   ?IMPRESSION:  ?Mild lumbar disc degeneration, greatest at L4-5 where advanced  ?posterior element hypertrophy contributes to mild spinal and right  ?neural foraminal stenosis.  ?   ?   ?Electronically Signed  ?  By: Sebastian Ache M.D.  ?  On: 11/26/2019 09:58  ? ? ? ?Objective:  VS:  HT:    WT:   BMI:     BP:138/80  HR:71bpm  TEMP: ( )  RESP:97 % ?Physical Exam ?Vitals and nursing note reviewed.  ?Constitutional:   ?   General: He is not in acute distress. ?   Appearance: Normal appearance. He is not ill-appearing.  ?HENT:  ?   Head: Normocephalic and atraumatic.  ?   Right Ear: External ear normal.  ?   Left Ear: External ear normal.  ?   Nose: No congestion.  ?Eyes:  ?   Extraocular Movements: Extraocular movements intact.  ?Cardiovascular:  ?   Rate and Rhythm: Normal rate.  ?   Pulses: Normal pulses.  ?Pulmonary:  ?   Effort: Pulmonary effort is normal. No respiratory distress.  ?Abdominal:  ?   General: There is no distension.  ?   Palpations: Abdomen is soft.  ?Musculoskeletal:     ?   General: No tenderness or signs of injury.  ?   Cervical back: Neck supple.  ?   Right lower leg: No edema.  ?   Left lower leg: No edema.  ?   Comments: Patient has good distal strength without clonus.  ?Skin: ?   Findings: No erythema or rash.  ?Neurological:  ?    General: No focal deficit present.  ?   Mental Status: He is alert and oriented to person, place, and time.  ?   Sensory: No sensory deficit.  ?   Motor: No weakness or abnormal muscle tone.  ?   Coordination: Coordination normal.  ?Psychiatric:     ?   Mood and Affect: Mood normal.     ?   Behavior: Behavior normal.  ?  ? ?Imaging: ?No results found. ?

## 2021-04-12 IMAGING — DX RIGHT RIBS AND CHEST - 3+ VIEW
3 series · 3 of 3 positions shown · non-contrast
Comparison: Chest x-ray dated 10/03/2010.

CLINICAL DATA: Fall, RIGHT-sided pain. Cough.

EXAM:
RIGHT RIBS AND CHEST - 3+ VIEW

[chest pa]
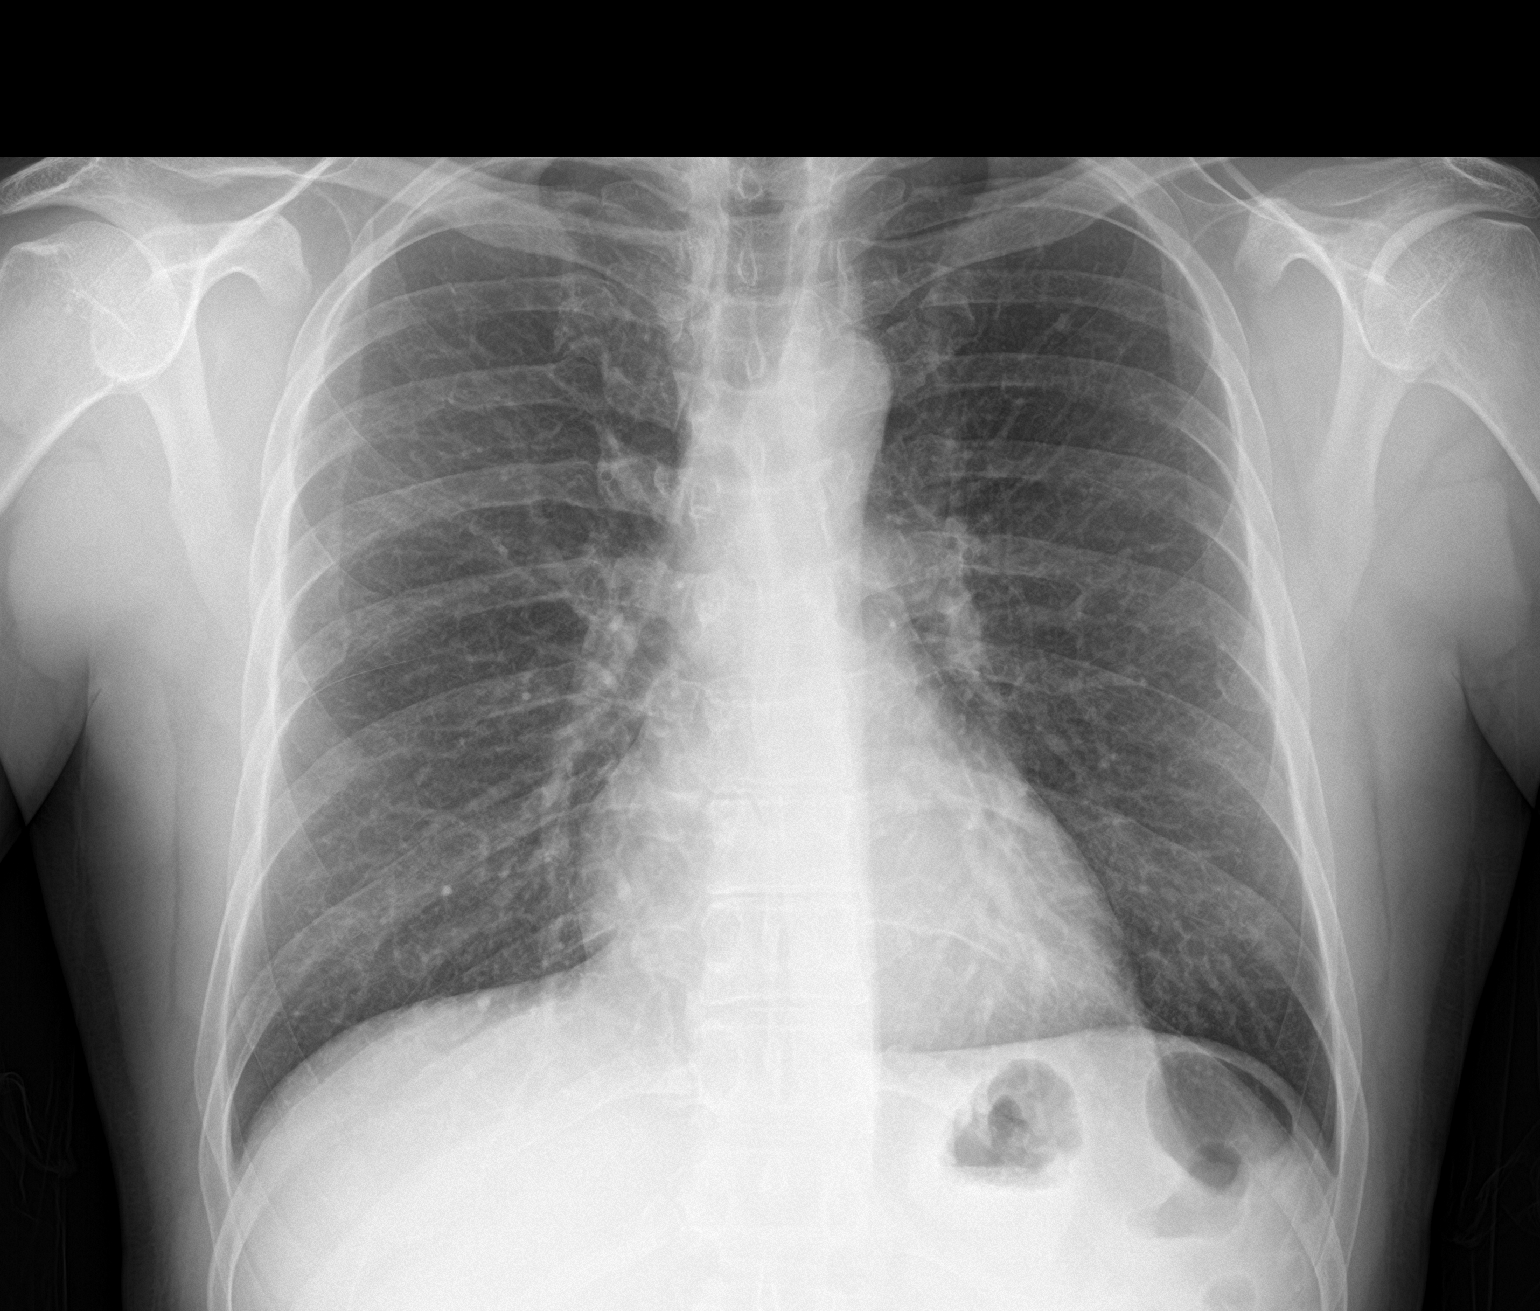

[rib pa]
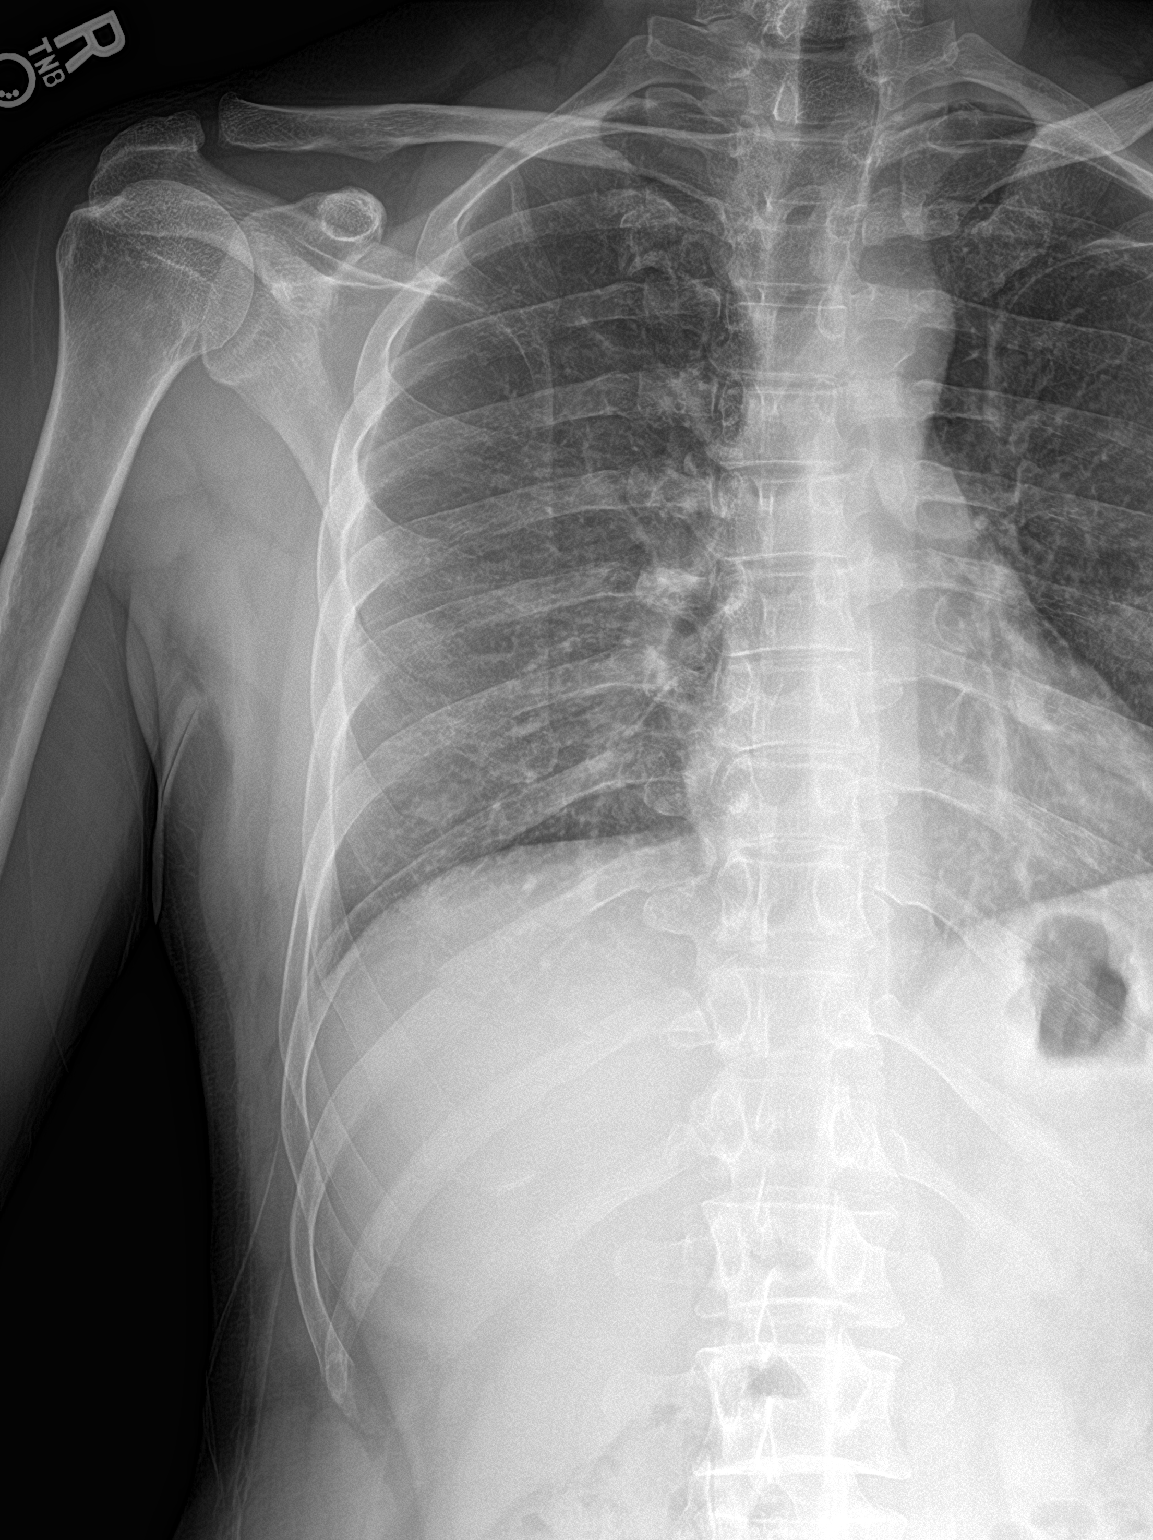

[rib obl]
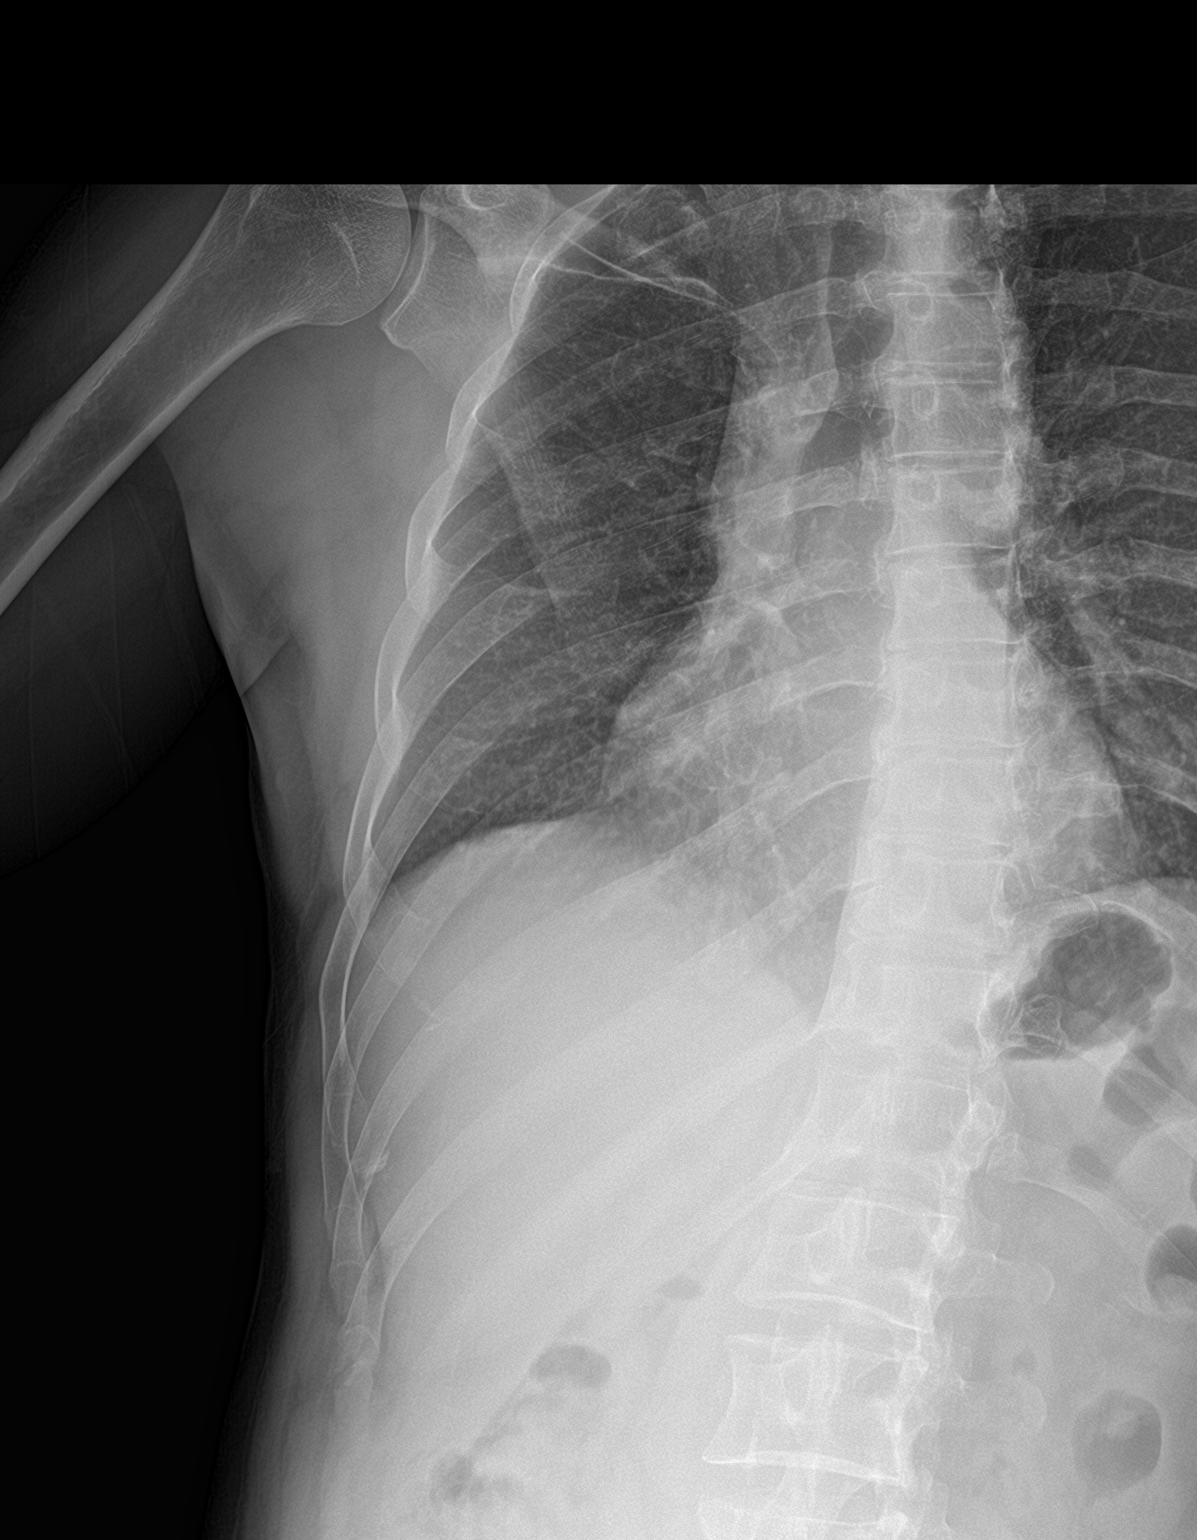

[3 of 3 positions shown; findings below may reference images not displayed]

FINDINGS: Heart size and mediastinal contours are within normal limits. Lungs
are clear. No pleural effusion or pneumothorax seen. Slightly
displaced fractures of the RIGHT posterior ninth and tenth ribs.
IMPRESSION: 1. Slightly displaced fractures of the RIGHT posterior ninth and
tenth ribs.
2. Lungs are clear. No pleural effusion or pneumothorax seen.

## 2022-03-30 ENCOUNTER — Encounter (HOSPITAL_COMMUNITY): Payer: Self-pay

## 2022-03-30 ENCOUNTER — Ambulatory Visit (HOSPITAL_COMMUNITY)
Admission: EM | Admit: 2022-03-30 | Discharge: 2022-03-30 | Disposition: A | Discharge status: Home / Self Care | Payer: BC Managed Care – PPO | Attending: Physician Assistant | Admitting: Physician Assistant

## 2022-03-30 DIAGNOSIS — M5416 Radiculopathy, lumbar region: Secondary | ICD-10-CM | POA: Diagnosis not present

## 2022-03-30 DIAGNOSIS — I10 Essential (primary) hypertension: Secondary | ICD-10-CM

## 2022-03-30 MED ORDER — TIZANIDINE HCL 2 MG PO TABS: 2.0000 mg | ORAL_TABLET | Freq: Three times a day (TID) | ORAL | 0 refills | Status: DC | PRN

## 2022-03-30 MED ORDER — AMLODIPINE BESYLATE 5 MG PO TABS: 5.0000 mg | ORAL_TABLET | Freq: Every day | ORAL | 0 refills | Status: DC

## 2022-03-30 MED ORDER — METHYLPREDNISOLONE SODIUM SUCC 125 MG IJ SOLR
80.0000 mg | Freq: Once | INTRAMUSCULAR | Status: AC
  Administered 2022-03-30: 80 mg via INTRAMUSCULAR

## 2022-03-30 MED ORDER — METHYLPREDNISOLONE SODIUM SUCC 125 MG IJ SOLR
INTRAMUSCULAR | Status: AC
  Filled 2022-03-30: qty 2

## 2022-03-30 MED ORDER — TRAMADOL HCL 50 MG PO TABS: 50.0000 mg | ORAL_TABLET | Freq: Three times a day (TID) | ORAL | 0 refills | Status: AC
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
## 2022-03-30 NOTE — ED Triage Notes (Signed)
Pt presents to the office for lower back pain. Pt stated this is ongoing issue for him.

## 2022-03-30 NOTE — Discharge Instructions (Signed)
Good to meet you today.  Please follow-up with Dr. Ernestina Patches, you will need to call his office for an appointment.  You also need to establish with primary care for ongoing preventive care and likely referral to pain management.  For acute worsening symptoms, please present to the emergency department.  You were given a steroid injection at the urgent care today, which should help with some of the symptoms.  You can take tramadol 3 times daily to help with moderate to severe pain.  Do not drive or operate heavy machinery with this medication.  Zanaflex to be taken as directed to help with muscle spasms.  Your blood pressure is also elevated.  I have started you on amlodipine 5 mg once daily.  Watch for swelling in the legs.  Again, you will need to follow-up with primary care to help monitor your blood pressure readings.

## 2022-04-01 ENCOUNTER — Telehealth: Payer: Self-pay | Admitting: Physical Medicine and Rehabilitation

## 2022-04-01 NOTE — Telephone Encounter (Signed)
Patient requesting an appointment with Dr. Newton 

## 2022-04-01 NOTE — Telephone Encounter (Signed)
Spoke with patient and scheduled an OV for 04/02/22. He was seen in urgent care and they told him to follow up here

## 2022-04-02 ENCOUNTER — Ambulatory Visit (INDEPENDENT_AMBULATORY_CARE_PROVIDER_SITE_OTHER): Payer: BC Managed Care – PPO | Admitting: Physical Medicine and Rehabilitation

## 2022-04-02 ENCOUNTER — Encounter: Payer: Self-pay | Admitting: Physical Medicine and Rehabilitation

## 2022-04-02 DIAGNOSIS — M5416 Radiculopathy, lumbar region: Secondary | ICD-10-CM

## 2022-04-02 DIAGNOSIS — M5441 Lumbago with sciatica, right side: Secondary | ICD-10-CM

## 2022-04-02 DIAGNOSIS — G8929 Other chronic pain: Secondary | ICD-10-CM

## 2022-04-02 DIAGNOSIS — M5442 Lumbago with sciatica, left side: Secondary | ICD-10-CM | POA: Diagnosis not present

## 2022-04-02 DIAGNOSIS — M47816 Spondylosis without myelopathy or radiculopathy, lumbar region: Secondary | ICD-10-CM
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
## 2022-05-03 LAB — HM COLONOSCOPY

## 2022-11-23 ENCOUNTER — Inpatient Hospital Stay (HOSPITAL_COMMUNITY)
Admission: EM | Disposition: A | Discharge status: Home / Self Care | Payer: Self-pay | Source: Home / Self Care | Attending: Cardiology

## 2022-11-23 ENCOUNTER — Emergency Department (HOSPITAL_COMMUNITY): Admit: 2022-11-23 | Payer: Medicaid Other | Admitting: Cardiology

## 2022-11-23 ENCOUNTER — Inpatient Hospital Stay (HOSPITAL_COMMUNITY)
Admission: EM | Admit: 2022-11-23 | Discharge: 2022-11-26 | DRG: 322 | Disposition: A | Discharge status: Home / Self Care | Payer: Medicaid Other | Attending: Cardiology | Admitting: Cardiology

## 2022-11-23 DIAGNOSIS — G8929 Other chronic pain: Secondary | ICD-10-CM | POA: Diagnosis present

## 2022-11-23 DIAGNOSIS — F17210 Nicotine dependence, cigarettes, uncomplicated: Secondary | ICD-10-CM | POA: Diagnosis present

## 2022-11-23 DIAGNOSIS — Z8249 Family history of ischemic heart disease and other diseases of the circulatory system: Secondary | ICD-10-CM | POA: Diagnosis not present

## 2022-11-23 DIAGNOSIS — I25118 Atherosclerotic heart disease of native coronary artery with other forms of angina pectoris: Secondary | ICD-10-CM | POA: Diagnosis not present

## 2022-11-23 DIAGNOSIS — Z823 Family history of stroke: Secondary | ICD-10-CM

## 2022-11-23 DIAGNOSIS — Z955 Presence of coronary angioplasty implant and graft: Secondary | ICD-10-CM

## 2022-11-23 DIAGNOSIS — I110 Hypertensive heart disease with heart failure: Secondary | ICD-10-CM | POA: Diagnosis present

## 2022-11-23 DIAGNOSIS — I219 Acute myocardial infarction, unspecified: Secondary | ICD-10-CM

## 2022-11-23 DIAGNOSIS — I2510 Atherosclerotic heart disease of native coronary artery without angina pectoris: Secondary | ICD-10-CM

## 2022-11-23 DIAGNOSIS — I255 Ischemic cardiomyopathy: Secondary | ICD-10-CM | POA: Diagnosis present

## 2022-11-23 DIAGNOSIS — Z79899 Other long term (current) drug therapy: Secondary | ICD-10-CM

## 2022-11-23 DIAGNOSIS — Z7982 Long term (current) use of aspirin: Secondary | ICD-10-CM

## 2022-11-23 DIAGNOSIS — Z8679 Personal history of other diseases of the circulatory system: Secondary | ICD-10-CM | POA: Insufficient documentation

## 2022-11-23 DIAGNOSIS — E785 Hyperlipidemia, unspecified: Secondary | ICD-10-CM

## 2022-11-23 DIAGNOSIS — I10 Essential (primary) hypertension: Secondary | ICD-10-CM | POA: Diagnosis present

## 2022-11-23 DIAGNOSIS — Z5971 Insufficient health insurance coverage: Secondary | ICD-10-CM | POA: Diagnosis not present

## 2022-11-23 DIAGNOSIS — M549 Dorsalgia, unspecified: Secondary | ICD-10-CM | POA: Diagnosis present

## 2022-11-23 DIAGNOSIS — I2111 ST elevation (STEMI) myocardial infarction involving right coronary artery: Principal | ICD-10-CM | POA: Diagnosis present

## 2022-11-23 DIAGNOSIS — Z7902 Long term (current) use of antithrombotics/antiplatelets: Secondary | ICD-10-CM | POA: Diagnosis not present

## 2022-11-23 DIAGNOSIS — I2119 ST elevation (STEMI) myocardial infarction involving other coronary artery of inferior wall: Principal | ICD-10-CM | POA: Diagnosis present

## 2022-11-23 DIAGNOSIS — I5021 Acute systolic (congestive) heart failure: Secondary | ICD-10-CM | POA: Insufficient documentation

## 2022-11-23 HISTORY — DX: Hyperlipidemia, unspecified: E78.5

## 2022-11-23 HISTORY — DX: Dorsalgia, unspecified: M54.9

## 2022-11-23 HISTORY — PX: LEFT HEART CATH AND CORONARY ANGIOGRAPHY: CATH118249

## 2022-11-23 HISTORY — PX: CORONARY/GRAFT ACUTE MI REVASCULARIZATION: CATH118305

## 2022-11-23 HISTORY — DX: Acute myocardial infarction, unspecified: I21.9

## 2022-11-23 HISTORY — DX: Unspecified osteoarthritis, unspecified site: M19.90

## 2022-11-23 LAB — COMPREHENSIVE METABOLIC PANEL
ALT: 36 U/L (ref 0–44)
AST: 24 U/L (ref 15–41)
Albumin: 3.2 g/dL — ABNORMAL LOW (ref 3.5–5.0)
Alkaline Phosphatase: 56 U/L (ref 38–126)
Anion gap: 10 (ref 5–15)
BUN: 11 mg/dL (ref 6–20)
CO2: 23 mmol/L (ref 22–32)
Calcium: 8.5 mg/dL — ABNORMAL LOW (ref 8.9–10.3)
Chloride: 101 mmol/L (ref 98–111)
Creatinine, Ser: 1.08 mg/dL (ref 0.61–1.24)
GFR, Estimated: >60 mL/min (ref >60)
Glucose, Bld: 184 mg/dL — ABNORMAL HIGH (ref 70–99)
Potassium: 3.4 mmol/L — ABNORMAL LOW (ref 3.5–5.1)
Sodium: 134 mmol/L — ABNORMAL LOW (ref 135–145)
Total Bilirubin: 0.9 mg/dL (ref <1.2)
Total Protein: 6.7 g/dL (ref 6.5–8.1)

## 2022-11-23 LAB — LIPID PANEL
Cholesterol: 181 mg/dL (ref 0–200)
HDL: 40 mg/dL — ABNORMAL LOW (ref >40)
LDL Cholesterol: 121 mg/dL — ABNORMAL HIGH (ref 0–99)
Total CHOL/HDL Ratio: 4.5 {ratio}
Triglycerides: 99 mg/dL (ref <150)
VLDL: 20 mg/dL (ref 0–40)

## 2022-11-23 LAB — POCT ACTIVATED CLOTTING TIME
Activated Clotting Time: 118 s
Activated Clotting Time: 262 s
Activated Clotting Time: 320 s

## 2022-11-23 LAB — PROTIME-INR
INR: 1.1 (ref 0.8–1.2)
Prothrombin Time: 14.2 s (ref 11.4–15.2)

## 2022-11-23 LAB — POCT I-STAT, CHEM 8
BUN: 10 mg/dL (ref 6–20)
Calcium, Ion: 1.18 mmol/L (ref 1.15–1.40)
Chloride: 100 mmol/L (ref 98–111)
Creatinine, Ser: 1.1 mg/dL (ref 0.61–1.24)
Glucose, Bld: 182 mg/dL — ABNORMAL HIGH (ref 70–99)
HCT: 39 % (ref 39.0–52.0)
Hemoglobin: 13.3 g/dL (ref 13.0–17.0)
Potassium: 3.4 mmol/L — ABNORMAL LOW (ref 3.5–5.1)
Sodium: 136 mmol/L (ref 135–145)
TCO2: 24 mmol/L (ref 22–32)

## 2022-11-23 LAB — CBC
HCT: 38.5 % — ABNORMAL LOW (ref 39.0–52.0)
Hemoglobin: 13.4 g/dL (ref 13.0–17.0)
MCH: 33 pg (ref 26.0–34.0)
MCHC: 34.8 g/dL (ref 30.0–36.0)
MCV: 94.8 fL (ref 80.0–100.0)
Platelets: 262 10*3/uL (ref 150–400)
RBC: 4.06 MIL/uL — ABNORMAL LOW (ref 4.22–5.81)
RDW: 12.7 % (ref 11.5–15.5)
WBC: 10.4 10*3/uL (ref 4.0–10.5)
nRBC: 0 % (ref 0.0–0.2)

## 2022-11-23 LAB — TROPONIN I (HIGH SENSITIVITY): Troponin I (High Sensitivity): 168 ng/L (ref <18)

## 2022-11-23 LAB — GLUCOSE, CAPILLARY: Glucose-Capillary: 164 mg/dL — ABNORMAL HIGH (ref 70–99)

## 2022-11-23 LAB — CG4 I-STAT (LACTIC ACID): Lactic Acid, Venous: 0.8 mmol/L (ref 0.5–1.9)

## 2022-11-23 LAB — HEMOGLOBIN A1C
Hgb A1c MFr Bld: 6.3 % — ABNORMAL HIGH (ref 4.8–5.6)
Mean Plasma Glucose: 134.11 mg/dL

## 2022-11-23 LAB — APTT: aPTT: 29 s (ref 24–36)

## 2022-11-23 SURGERY — CORONARY/GRAFT ACUTE MI REVASCULARIZATION
Anesthesia: LOCAL

## 2022-11-23 MED ORDER — ACETAMINOPHEN 325 MG PO TABS: 650.0000 mg | ORAL_TABLET | ORAL | Status: DC | PRN

## 2022-11-23 MED ORDER — ATORVASTATIN CALCIUM 80 MG PO TABS
80.0000 mg | ORAL_TABLET | Freq: Every day | ORAL | Status: DC
  Administered 2022-11-24 – 2022-11-26 (×3): 80 mg via ORAL
  Filled 2022-11-23 (×3): qty 1

## 2022-11-23 MED ORDER — LABETALOL HCL 5 MG/ML IV SOLN
10.0000 mg | INTRAVENOUS | Status: AC | PRN
Start: 2022-11-23 — End: 2022-11-24
  Administered 2022-11-23: 10 mg via INTRAVENOUS
  Filled 2022-11-23: qty 4

## 2022-11-23 MED ORDER — LIDOCAINE HCL (PF) 1 % IJ SOLN
INTRAMUSCULAR | Status: AC
  Filled 2022-11-23: qty 30

## 2022-11-23 MED ORDER — ONDANSETRON HCL 4 MG/2ML IJ SOLN
INTRAMUSCULAR | Status: DC | PRN
  Administered 2022-11-23: 4 mg via INTRAVENOUS

## 2022-11-23 MED ORDER — MIDAZOLAM HCL 2 MG/2ML IJ SOLN
INTRAMUSCULAR | Status: DC | PRN
  Administered 2022-11-23: 1 mg via INTRAVENOUS

## 2022-11-23 MED ORDER — MORPHINE SULFATE (PF) 2 MG/ML IV SOLN: 2.0000 mg | INTRAVENOUS | Status: DC | PRN

## 2022-11-23 MED ORDER — VERAPAMIL HCL 2.5 MG/ML IV SOLN
INTRAVENOUS | Status: DC | PRN
  Administered 2022-11-23: 10 mL via INTRA_ARTERIAL

## 2022-11-23 MED ORDER — SODIUM CHLORIDE 0.9 % IV SOLN
INTRAVENOUS | Status: AC | PRN
  Administered 2022-11-23: 10 mL/h via INTRAVENOUS

## 2022-11-23 MED ORDER — PHENYLEPHRINE 80 MCG/ML (10ML) SYRINGE FOR IV PUSH (FOR BLOOD PRESSURE SUPPORT)
PREFILLED_SYRINGE | INTRAVENOUS | Status: DC | PRN
  Administered 2022-11-23: 240 ug via INTRAVENOUS

## 2022-11-23 MED ORDER — VERAPAMIL HCL 2.5 MG/ML IV SOLN
INTRAVENOUS | Status: AC
  Filled 2022-11-23: qty 2

## 2022-11-23 MED ORDER — FENTANYL CITRATE (PF) 100 MCG/2ML IJ SOLN
INTRAMUSCULAR | Status: DC | PRN
  Administered 2022-11-23: 50 ug via INTRAVENOUS

## 2022-11-23 MED ORDER — ASPIRIN 81 MG PO CHEW
81.0000 mg | CHEWABLE_TABLET | Freq: Every day | ORAL | Status: DC
  Administered 2022-11-24 – 2022-11-26 (×3): 81 mg via ORAL
  Filled 2022-11-23 (×3): qty 1

## 2022-11-23 MED ORDER — TICAGRELOR 90 MG PO TABS
ORAL_TABLET | ORAL | Status: DC | PRN
  Administered 2022-11-23: 180 mg via ORAL

## 2022-11-23 MED ORDER — SODIUM CHLORIDE 0.9 % IV SOLN: INTRAVENOUS | Status: AC

## 2022-11-23 MED ORDER — ORAL CARE MOUTH RINSE: 15.0000 mL | OROMUCOSAL | Status: DC | PRN

## 2022-11-23 MED ORDER — FENTANYL CITRATE (PF) 100 MCG/2ML IJ SOLN
INTRAMUSCULAR | Status: AC
  Filled 2022-11-23: qty 2

## 2022-11-23 MED ORDER — HEPARIN SODIUM (PORCINE) 1000 UNIT/ML IJ SOLN
INTRAMUSCULAR | Status: DC | PRN
  Administered 2022-11-23: 8000 [IU] via INTRAVENOUS
  Administered 2022-11-23: 3000 [IU] via INTRAVENOUS

## 2022-11-23 MED ORDER — SODIUM CHLORIDE 0.9% FLUSH
3.0000 mL | INTRAVENOUS | Status: DC | PRN
Start: 2022-11-24 — End: 2022-11-26

## 2022-11-23 MED ORDER — IOHEXOL 350 MG/ML SOLN
INTRAVENOUS | Status: DC | PRN
  Administered 2022-11-23: 85 mL

## 2022-11-23 MED ORDER — PHENYLEPHRINE 80 MCG/ML (10ML) SYRINGE FOR IV PUSH (FOR BLOOD PRESSURE SUPPORT): 80.0000 ug | PREFILLED_SYRINGE | Freq: Once | INTRAVENOUS | Status: DC | PRN

## 2022-11-23 MED ORDER — SODIUM CHLORIDE 0.9% FLUSH
3.0000 mL | Freq: Two times a day (BID) | INTRAVENOUS | Status: DC
  Administered 2022-11-24 – 2022-11-25 (×3): 3 mL via INTRAVENOUS

## 2022-11-23 MED ORDER — AMLODIPINE BESYLATE 5 MG PO TABS
5.0000 mg | ORAL_TABLET | Freq: Every day | ORAL | Status: DC
  Administered 2022-11-24 – 2022-11-25 (×2): 5 mg via ORAL
  Filled 2022-11-23 (×2): qty 1

## 2022-11-23 MED ORDER — TICAGRELOR 90 MG PO TABS
90.0000 mg | ORAL_TABLET | Freq: Two times a day (BID) | ORAL | Status: DC
  Administered 2022-11-24 – 2022-11-26 (×5): 90 mg via ORAL
  Filled 2022-11-23 (×5): qty 1

## 2022-11-23 MED ORDER — TICAGRELOR 90 MG PO TABS
ORAL_TABLET | ORAL | Status: AC
  Filled 2022-11-23: qty 2

## 2022-11-23 MED ORDER — MIDAZOLAM HCL 2 MG/2ML IJ SOLN
INTRAMUSCULAR | Status: AC
  Filled 2022-11-23: qty 2

## 2022-11-23 MED ORDER — LIDOCAINE HCL (PF) 1 % IJ SOLN
INTRAMUSCULAR | Status: DC | PRN
  Administered 2022-11-23: 2 mL

## 2022-11-23 MED ORDER — HEPARIN SODIUM (PORCINE) 1000 UNIT/ML IJ SOLN
INTRAMUSCULAR | Status: AC
  Filled 2022-11-23: qty 10

## 2022-11-23 MED ORDER — HEPARIN (PORCINE) IN NACL 1000-0.9 UT/500ML-% IV SOLN
INTRAVENOUS | Status: DC | PRN
  Administered 2022-11-23: 500 mL

## 2022-11-23 MED ORDER — ONDANSETRON HCL 4 MG/2ML IJ SOLN
INTRAMUSCULAR | Status: AC
  Filled 2022-11-23: qty 2

## 2022-11-23 MED ORDER — PHENYLEPHRINE HCL-NACL 20-0.9 MG/250ML-% IV SOLN: INTRAVENOUS | Status: DC | PRN

## 2022-11-23 MED ORDER — TIZANIDINE HCL 2 MG PO TABS: 2.0000 mg | ORAL_TABLET | Freq: Three times a day (TID) | ORAL | Status: DC | PRN

## 2022-11-23 MED ORDER — HYDRALAZINE HCL 20 MG/ML IJ SOLN
10.0000 mg | INTRAMUSCULAR | Status: AC | PRN
Start: 2022-11-23 — End: 2022-11-24

## 2022-11-23 MED ORDER — SODIUM CHLORIDE 0.9 % IV SOLN
250.0000 mL | INTRAVENOUS | Status: DC | PRN
Start: 2022-11-24 — End: 2022-11-25

## 2022-11-23 SURGICAL SUPPLY — 18 items
BALLN SAPPHIRE 2.5X12 (BALLOONS) ×1
BALLOON SAPPHIRE 2.5X12 (BALLOONS) IMPLANT
CATH INFINITI 5 FR JL3.5 (CATHETERS) IMPLANT
CATH INFINITI AMBI 5FR TG (CATHETERS) IMPLANT
CATH VISTA GUIDE 6FR JR4 (CATHETERS) IMPLANT
DEVICE RAD COMP TR BAND LRG (VASCULAR PRODUCTS) IMPLANT
GLIDESHEATH SLEND SS 6F .021 (SHEATH) IMPLANT
GUIDEWIRE INQWIRE 1.5J.035X260 (WIRE) IMPLANT
INQWIRE 1.5J .035X260CM (WIRE) ×1
KIT ENCORE 26 ADVANTAGE (KITS) IMPLANT
KIT ESSENTIALS PG (KITS) IMPLANT
KIT SYRINGE INJ CVI SPIKEX1 (MISCELLANEOUS) IMPLANT
PACK CARDIAC CATHETERIZATION (CUSTOM PROCEDURE TRAY) ×1 IMPLANT
SET ATX-X65L (MISCELLANEOUS) IMPLANT
SHEATH PROBE COVER 6X72 (BAG) IMPLANT
STENT ONYX FRONTIER 2.75X30 (Permanent Stent) IMPLANT
TUBING CIL FLEX 10 FLL-RA (TUBING) IMPLANT
WIRE ASAHI PROWATER 180CM (WIRE) IMPLANT
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
## 2022-11-24 ENCOUNTER — Other Ambulatory Visit: Payer: Self-pay

## 2022-11-24 ENCOUNTER — Encounter (HOSPITAL_COMMUNITY): Payer: Self-pay | Admitting: Cardiology

## 2022-11-24 DIAGNOSIS — I2119 ST elevation (STEMI) myocardial infarction involving other coronary artery of inferior wall: Secondary | ICD-10-CM

## 2022-11-24 DIAGNOSIS — E119 Type 2 diabetes mellitus without complications: Secondary | ICD-10-CM

## 2022-11-24 HISTORY — DX: Type 2 diabetes mellitus without complications: E11.9

## 2022-11-24 LAB — TROPONIN I (HIGH SENSITIVITY): Troponin I (High Sensitivity): 23463 ng/L (ref <18)

## 2022-11-24 LAB — CBC
HCT: 39.2 % (ref 39.0–52.0)
Hemoglobin: 13.2 g/dL (ref 13.0–17.0)
MCH: 32.6 pg (ref 26.0–34.0)
MCHC: 33.7 g/dL (ref 30.0–36.0)
MCV: 96.8 fL (ref 80.0–100.0)
Platelets: 256 10*3/uL (ref 150–400)
RBC: 4.05 MIL/uL — ABNORMAL LOW (ref 4.22–5.81)
RDW: 12.8 % (ref 11.5–15.5)
WBC: 6.9 10*3/uL (ref 4.0–10.5)
nRBC: 0 % (ref 0.0–0.2)

## 2022-11-24 LAB — HEMOGLOBIN A1C
Hgb A1c MFr Bld: 6.5 % — ABNORMAL HIGH (ref 4.8–5.6)
Mean Plasma Glucose: 139.85 mg/dL

## 2022-11-24 LAB — LIPID PANEL
Cholesterol: 169 mg/dL (ref 0–200)
HDL: 41 mg/dL (ref >40)
LDL Cholesterol: 113 mg/dL — ABNORMAL HIGH (ref 0–99)
Total CHOL/HDL Ratio: 4.1 {ratio}
Triglycerides: 77 mg/dL (ref <150)
VLDL: 15 mg/dL (ref 0–40)

## 2022-11-24 LAB — BASIC METABOLIC PANEL
Anion gap: 9 (ref 5–15)
BUN: 8 mg/dL (ref 6–20)
CO2: 23 mmol/L (ref 22–32)
Calcium: 8.5 mg/dL — ABNORMAL LOW (ref 8.9–10.3)
Chloride: 104 mmol/L (ref 98–111)
Creatinine, Ser: 1.3 mg/dL — ABNORMAL HIGH (ref 0.61–1.24)
GFR, Estimated: >60 mL/min (ref >60)
Glucose, Bld: 155 mg/dL — ABNORMAL HIGH (ref 70–99)
Potassium: 4.2 mmol/L (ref 3.5–5.1)
Sodium: 136 mmol/L (ref 135–145)

## 2022-11-24 LAB — TSH: TSH: 1.196 u[IU]/mL (ref 0.350–4.500)

## 2022-11-24 LAB — MRSA NEXT GEN BY PCR, NASAL: MRSA by PCR Next Gen: NOT DETECTED

## 2022-11-24 MED ORDER — CHLORHEXIDINE GLUCONATE CLOTH 2 % EX PADS
6.0000 | MEDICATED_PAD | Freq: Every day | CUTANEOUS | Status: DC
  Administered 2022-11-24 – 2022-11-26 (×3): 6 via TOPICAL

## 2022-11-24 NOTE — Plan of Care (Signed)

## 2022-11-24 NOTE — Progress Notes (Signed)
   Rounding Note    Patient Name: Wayne Huff Date of Encounter: 11/24/2022  [facility] Cardiologist: None   Subjective   NAEO No CP.  Vital Signs    Vitals:   11/24/22 0300 11/24/22 0400 11/24/22 0500 11/24/22 0600  BP: 98/65 107/71 123/78 114/73  Pulse:      Resp: 11 11 12 11   Temp:  98.2 F (36.8 C)    TempSrc:  Oral    SpO2: 96% 97% 97% 97%  Weight:      Height:        Intake/Output Summary (Last 24 hours) at 11/24/2022 0830 Last data filed at 11/24/2022 0600 Gross per 24 hour  Intake 600.43 ml  Output 650 ml  Net -49.57 ml      11/23/2022    9:30 PM 11/23/2022    8:40 PM 02/20/2021    1:52 PM  Last 3 Weights  Weight (lbs) 184 lb 8.4 oz 185 lb 160 lb  Weight (kg) 83.7 kg 83.915 kg 72.576 kg      Telemetry    Personally Reviewed  ECG     Personally Reviewed  Physical Exam   GEN: No acute distress.   Cardiac: RRR, no murmurs, rubs, or gallops.  Respiratory: Clear to auscultation bilaterally. Psych: Normal affect   Assessment & Plan    #STEMI #CAD Feeling well this AM. He is hemodynamically/electrically stable this AM.  Planning for staged PCI prior to discharge. Cont ASA/ticagrelor Cont statin  #HTN Cont amlodipine  Keep NPO after MN.   Sheria Lang T. Lalla Brothers, MD, [facility], [facility] Cardiac Electrophysiology

## 2022-11-25 ENCOUNTER — Other Ambulatory Visit (HOSPITAL_COMMUNITY): Payer: Medicaid Other

## 2022-11-25 ENCOUNTER — Other Ambulatory Visit (HOSPITAL_COMMUNITY): Payer: Self-pay

## 2022-11-25 ENCOUNTER — Encounter (HOSPITAL_COMMUNITY): Payer: Self-pay | Admitting: Cardiology

## 2022-11-25 ENCOUNTER — Encounter (HOSPITAL_COMMUNITY)
Admission: EM | Disposition: A | Discharge status: Home / Self Care | Payer: Self-pay | Source: Home / Self Care | Attending: Cardiology

## 2022-11-25 DIAGNOSIS — I25118 Atherosclerotic heart disease of native coronary artery with other forms of angina pectoris: Secondary | ICD-10-CM | POA: Diagnosis not present

## 2022-11-25 HISTORY — PX: CORONARY STENT INTERVENTION: CATH118234

## 2022-11-25 LAB — POCT ACTIVATED CLOTTING TIME
Activated Clotting Time: 314 s
Activated Clotting Time: 417 s

## 2022-11-25 SURGERY — CORONARY STENT INTERVENTION
Anesthesia: LOCAL

## 2022-11-25 MED ORDER — FENTANYL CITRATE (PF) 100 MCG/2ML IJ SOLN
INTRAMUSCULAR | Status: AC
  Filled 2022-11-25: qty 2

## 2022-11-25 MED ORDER — SODIUM CHLORIDE 0.9% FLUSH
3.0000 mL | Freq: Two times a day (BID) | INTRAVENOUS | Status: DC
  Administered 2022-11-25 – 2022-11-26 (×2): 3 mL via INTRAVENOUS

## 2022-11-25 MED ORDER — HEPARIN SODIUM (PORCINE) 1000 UNIT/ML IJ SOLN
INTRAMUSCULAR | Status: AC
Start: 2022-11-25 — End: ?
  Filled 2022-11-25: qty 10

## 2022-11-25 MED ORDER — MIDAZOLAM HCL 2 MG/2ML IJ SOLN
INTRAMUSCULAR | Status: DC | PRN
  Administered 2022-11-25: 1 mg via INTRAVENOUS

## 2022-11-25 MED ORDER — LABETALOL HCL 5 MG/ML IV SOLN: 10.0000 mg | INTRAVENOUS | Status: AC | PRN

## 2022-11-25 MED ORDER — IOHEXOL 350 MG/ML SOLN
INTRAVENOUS | Status: DC | PRN
  Administered 2022-11-25: 60 mL

## 2022-11-25 MED ORDER — SODIUM CHLORIDE 0.9% FLUSH: 3.0000 mL | Freq: Two times a day (BID) | INTRAVENOUS | Status: DC

## 2022-11-25 MED ORDER — LIDOCAINE HCL (PF) 1 % IJ SOLN
INTRAMUSCULAR | Status: AC
  Filled 2022-11-25: qty 30

## 2022-11-25 MED ORDER — ASPIRIN 81 MG PO CHEW: 81.0000 mg | CHEWABLE_TABLET | ORAL | Status: DC

## 2022-11-25 MED ORDER — HEPARIN (PORCINE) IN NACL 1000-0.9 UT/500ML-% IV SOLN
INTRAVENOUS | Status: DC | PRN
  Administered 2022-11-25 (×2): 500 mL

## 2022-11-25 MED ORDER — MIDAZOLAM HCL 2 MG/2ML IJ SOLN
INTRAMUSCULAR | Status: AC
Start: 2022-11-25 — End: ?
  Filled 2022-11-25: qty 2

## 2022-11-25 MED ORDER — SODIUM CHLORIDE 0.9 % IV SOLN: 250.0000 mL | INTRAVENOUS | Status: DC | PRN

## 2022-11-25 MED ORDER — SODIUM CHLORIDE 0.9 % IV SOLN: INTRAVENOUS | Status: DC

## 2022-11-25 MED ORDER — ONDANSETRON HCL 4 MG/2ML IJ SOLN: 4.0000 mg | Freq: Four times a day (QID) | INTRAMUSCULAR | Status: DC | PRN

## 2022-11-25 MED ORDER — VERAPAMIL HCL 2.5 MG/ML IV SOLN
INTRAVENOUS | Status: AC
  Filled 2022-11-25: qty 2

## 2022-11-25 MED ORDER — SODIUM CHLORIDE 0.9% FLUSH: 3.0000 mL | INTRAVENOUS | Status: DC | PRN

## 2022-11-25 MED ORDER — HEPARIN SODIUM (PORCINE) 1000 UNIT/ML IJ SOLN
INTRAMUSCULAR | Status: DC | PRN
  Administered 2022-11-25: 7000 [IU] via INTRAVENOUS

## 2022-11-25 MED ORDER — SODIUM CHLORIDE 0.9 % IV SOLN: INTRAVENOUS | Status: AC

## 2022-11-25 MED ORDER — NITROGLYCERIN 1 MG/10 ML FOR IR/CATH LAB
INTRA_ARTERIAL | Status: AC
  Filled 2022-11-25: qty 10

## 2022-11-25 MED ORDER — HYDRALAZINE HCL 20 MG/ML IJ SOLN: 10.0000 mg | INTRAMUSCULAR | Status: AC | PRN

## 2022-11-25 MED ORDER — LIDOCAINE HCL (PF) 1 % IJ SOLN
INTRAMUSCULAR | Status: DC | PRN
  Administered 2022-11-25: 2 mL via INTRADERMAL

## 2022-11-25 MED ORDER — FENTANYL CITRATE (PF) 100 MCG/2ML IJ SOLN
INTRAMUSCULAR | Status: DC | PRN
  Administered 2022-11-25: 25 ug via INTRAVENOUS

## 2022-11-25 MED ORDER — VERAPAMIL HCL 2.5 MG/ML IV SOLN
INTRAVENOUS | Status: DC | PRN
  Administered 2022-11-25: 10 mL via INTRA_ARTERIAL

## 2022-11-25 SURGICAL SUPPLY — 19 items
BALLN EMERGE MR 2.0X12 (BALLOONS) ×1
BALLN SAPPHIRE 2.5X12 (BALLOONS) ×1
BALLOON EMERGE MR 2.0X12 (BALLOONS) IMPLANT
BALLOON SAPPHIRE 2.5X12 (BALLOONS) IMPLANT
CATH LAUNCHER 6FR EBU3.5 (CATHETERS) IMPLANT
DEVICE RAD COMP TR BAND LRG (VASCULAR PRODUCTS) IMPLANT
ELECT DEFIB PAD ADLT CADENCE (PAD) IMPLANT
GLIDESHEATH SLEND SS 6F .021 (SHEATH) IMPLANT
GUIDEWIRE INQWIRE 1.5J.035X260 (WIRE) IMPLANT
INQWIRE 1.5J .035X260CM (WIRE) ×1
KIT ENCORE 26 ADVANTAGE (KITS) IMPLANT
KIT HEMO VALVE WATCHDOG (MISCELLANEOUS) IMPLANT
PACK CARDIAC CATHETERIZATION (CUSTOM PROCEDURE TRAY) ×1 IMPLANT
SET ATX-X65L (MISCELLANEOUS) IMPLANT
SHEATH PROBE COVER 6X72 (BAG) IMPLANT
STENT SYNERGY XD 2.25X28 (Permanent Stent) IMPLANT
SYNERGY XD 2.25X28 (Permanent Stent) ×1 IMPLANT
TUBING CIL FLEX 10 FLL-RA (TUBING) IMPLANT
WIRE ASAHI PROWATER 180CM (WIRE) IMPLANT

## 2022-11-25 NOTE — Progress Notes (Signed)
Transition of Care [facility]) - Inpatient Brief Assessment   Patient Details  Name: NORTON CLEMMENS MRN: [id number] Date of Birth: July 27, 1970  Transition of Care [facility]) CM/SW Contact:    Ronny Bacon, RN Phone Number: 11/25/2022, 3:45 PM   Clinical Narrative: Patient with medicaid insurance, no PCP listed on chart. Pt should have one assigned through [facility] office. Pt had PCI done today. May need food resources.   Transition of Care Asessment: Insurance and Status: (P) Insurance coverage has been reviewed Patient has primary care physician: (P) No (Pt has medicaid insurance and should have a PCP assigned to him) Home environment has been reviewed: (P) Home     Social Determinants of Health Reivew: (P) SDOH reviewed needs interventions (May need food resources) Readmission risk has been reviewed: (P) Yes Transition of care needs: (P) transition of care needs identified, TOC will continue to follow

## 2022-11-25 NOTE — Progress Notes (Addendum)
CARDIAC REHAB PHASE I   PRE:  Rate/Rhythm: 82 NSR  BP:  Sitting: 134/74      SpO2: 98 RA  MODE:  Ambulation: 370 ft    POST:  Rate/Rhythm: 94 NSR  BP:  Sitting: 160/99      SpO2: 98 RA  Pt ambulated with supervision assist, pt walked well w/o symptoms. Pt educated with famly present.  Pt was educated on STEMI, stent card, stent location, Antiplatelet and ASA use, wt restrictions, no baths/daily wash-ups, s/s of infection, ex guidelines, s/s to stop exercising, NTG use and calling 911, heart healthy diet, risk factors and CRPII. Pt received MI book and materials on exercise, diet, and CRPII. Will refer to [facility].   Encouraged a more active lifestyle and healthier eating to lower A1C, pt is interested in [place].     Faustino Congress  MS, ACSM-CEP 9:21 AM 11/25/2022    Service time is from 0826 to 0921.

## 2022-11-25 NOTE — Interval H&P Note (Signed)
History and Physical Interval Note:  11/25/2022 12:26 PM  HAWKEYE PINE  has presented today for surgery, with the diagnosis of cad.  The various methods of treatment have been discussed with the patient and family. After consideration of risks, benefits and other options for treatment, the patient has consented to  Procedure(s): CORONARY STENT INTERVENTION (N/A) as a surgical intervention.  The patient's history has been reviewed, patient examined, no change in status, stable for surgery.  I have reviewed the patient's chart and labs.  Questions were answered to the patient's satisfaction.     Bryan Lemma

## 2022-11-25 NOTE — Progress Notes (Signed)
Rounding Note    Patient Name: Wayne Huff Date of Encounter: 11/25/2022  [facility] Cardiologist: None [place]  Subjective   No chest pain. Several episodes of dyspnea.   Inpatient Medications    Scheduled Meds:  amLODipine  5 mg Oral Daily   aspirin  81 mg Oral Daily   atorvastatin  80 mg Oral Daily   Chlorhexidine Gluconate Cloth  6 each Topical Daily   sodium chloride flush  3 mL Intravenous Q12H   ticagrelor  90 mg Oral BID   Continuous Infusions:  PRN Meds: acetaminophen, morphine injection, mouth rinse, phenylephrine, sodium chloride flush, tiZANidine   Vital Signs    Vitals:   11/25/22 0500 11/25/22 0600 11/25/22 0700 11/25/22 0800  BP: 124/85  139/80 134/74  Pulse:    65  Resp: 14  13 (!) 8  Temp:      TempSrc:      SpO2: 93%  95% 95%  Weight:  80.9 kg    Height:        Intake/Output Summary (Last 24 hours) at 11/25/2022 0835 Last data filed at 11/24/2022 2000 Gross per 24 hour  Intake 480 ml  Output --  Net 480 ml      11/25/2022    6:00 AM 11/23/2022    9:30 PM 11/23/2022    8:40 PM  Last 3 Weights  Weight (lbs) 178 lb 5.6 oz 184 lb 8.4 oz 185 lb  Weight (kg) 80.9 kg 83.7 kg 83.915 kg      Telemetry    Sinus rhythm, short runs of NSVT - Personally Reviewed  ECG    No am tracing - Personally Reviewed  Physical Exam   GEN: No acute distress.   Neck: No JVD Cardiac: RRR, no murmurs, rubs, or gallops.  Respiratory: Clear to auscultation bilaterally. GI: Soft, nontender, non-distended  MS: No edema; No deformity. Neuro:  Nonfocal  Psych: Normal affect   Labs    High Sensitivity Troponin:   Recent Labs  Lab 11/23/22 2025 11/24/22 0722  TROPONINIHS 168* 23,463*     Chemistry Recent Labs  Lab 11/23/22 2025 11/23/22 2031 11/24/22 0722  NA 134* 136 136  K 3.4* 3.4* 4.2  CL 101 100 104  CO2 23  --  23  GLUCOSE 184* 182* 155*  BUN 11 10 8   CREATININE 1.08 1.10 1.30*  CALCIUM 8.5*  --  8.5*  PROT 6.7   --   --   ALBUMIN 3.2*  --   --   AST 24  --   --   ALT 36  --   --   ALKPHOS 56  --   --   BILITOT 0.9  --   --   GFRNONAA >60  --  >60  ANIONGAP 10  --  9    Lipids  Recent Labs  Lab 11/24/22 0722  CHOL 169  TRIG 77  HDL 41  LDLCALC 113*  CHOLHDL 4.1    Hematology Recent Labs  Lab 11/23/22 2025 11/23/22 2031 11/24/22 0722  WBC 10.4  --  6.9  RBC 4.06*  --  4.05*  HGB 13.4 13.3 13.2  HCT 38.5* 39.0 39.2  MCV 94.8  --  96.8  MCH 33.0  --  32.6  MCHC 34.8  --  33.7  RDW 12.7  --  12.8  PLT 262  --  256   Thyroid  Recent Labs  Lab 11/24/22 0722  TSH 1.196    BNPNo results  for input(s): "BNP", "PROBNP" in the last 168 hours.  DDimer No results for input(s): "DDIMER" in the last 168 hours.   Radiology    CARDIAC CATHETERIZATION  Result Date: 11/23/2022   CULPRIT LESION Segment: Prox RCA-1 lesion is 60% stenosed.  Prox RCA-2 lesion is 100% stenosed.  Prox RCA to Mid RCA lesion is 90% stenosed.   A drug-eluting stent was successfully placed covering all 3 lesions, using a STENT ONYX FRONTIER 2.75X30 -> deployed to 2.9 mm.  Post intervention, there is a 0% residual stenosis.   Mid RCA to Dist RCA lesion is 40% stenosed.   ---------------------------------------------------------------   2nd Mrg lesion is 80% stenosed. ->  Consider staged PCI   Prox LAD to Mid LAD lesion is 30% stenosed.   ---------------------------------------------------------------   There is mild left ventricular systolic dysfunction. The left ventricular ejection fraction is 45-50% by visual estimate. LV end diastolic pressure is moderately elevated.   --------------------------------------------------------------. POST-CATH DIAGNOSES Severe two-vessel disease: Culprit lesion is 100% thrombotic occlusion of the RCA between sidebranches; TIMI 0 flow Successful DES PCI of proximal to mid RCA with Synergy XD; TIMI-3 flow restored Proximal 2nd Mrg focal 80% Proximal LAD 30% at SP1 Mildly reduced LVEF with  apparent inferior hypokinesis. EF 45 to 50%. LVEDP 24 mmHg. RECOMMENDATIONS  In the absence of any other complications or medical issues, we expect the patient to be ready for discharge from an interventional cardiology perspective on 11/25/2022.   Will ask to interventional colleagues to review films, consider staged PCI of 2nd Mrg prior to discharge (could be discharged same day if done early enough). Check 2D echo   Recommend uninterrupted dual antiplatelet therapy with Aspirin 81mg  daily and Ticagrelor 90mg  twice daily for a minimum of 12 months (ACS-Class I recommendation).   Continue Thienopyridine SAPT monotherapy for second year.  (Okay to interrupt at that time) High-dose statin-80 mg atorvastatin Consider reinitiating antihypertensives-beta-blocker/ARB once blood pressure stabilizes in the morning. Bryan Lemma, MD   Cardiac Studies     Patient Profile     52 y.o. male with history of HTN admitted 11/23/22 with an acute inferior STEMI secondary to occlusion of the proximal RCA which was treated with a drug eluting stent. Also with severe obtuse marginal stenosis with plans for staged PCI.   Assessment & Plan    CAD/Inferior STEMI: Doing well this am following PCI of the RCA which was the culprit vessel. Troponin over 23,000. Plans for staged PCI of the OM lesion today. He is chest pain free. Will continue DAPT with ASA/Brilinta. Continue statin. Echo pending today.   For questions or updates, please contact [facility] Please consult [email or web address] for contact info under        Signed, Verne Carrow, MD  11/25/2022, 8:35 AM

## 2022-11-25 NOTE — H&P (View-Only) (Signed)
Rounding Note    Patient Name: Wayne Huff Date of Encounter: 11/25/2022  [facility] Cardiologist: None [place]  Subjective   No chest pain. Several episodes of dyspnea.   Inpatient Medications    Scheduled Meds:  amLODipine  5 mg Oral Daily   aspirin  81 mg Oral Daily   atorvastatin  80 mg Oral Daily   Chlorhexidine Gluconate Cloth  6 each Topical Daily   sodium chloride flush  3 mL Intravenous Q12H   ticagrelor  90 mg Oral BID   Continuous Infusions:  PRN Meds: acetaminophen, morphine injection, mouth rinse, phenylephrine, sodium chloride flush, tiZANidine   Vital Signs    Vitals:   11/25/22 0500 11/25/22 0600 11/25/22 0700 11/25/22 0800  BP: 124/85  139/80 134/74  Pulse:    65  Resp: 14  13 (!) 8  Temp:      TempSrc:      SpO2: 93%  95% 95%  Weight:  80.9 kg    Height:        Intake/Output Summary (Last 24 hours) at 11/25/2022 0835 Last data filed at 11/24/2022 2000 Gross per 24 hour  Intake 480 ml  Output --  Net 480 ml      11/25/2022    6:00 AM 11/23/2022    9:30 PM 11/23/2022    8:40 PM  Last 3 Weights  Weight (lbs) 178 lb 5.6 oz 184 lb 8.4 oz 185 lb  Weight (kg) 80.9 kg 83.7 kg 83.915 kg      Telemetry    Sinus rhythm, short runs of NSVT - Personally Reviewed  ECG    No am tracing - Personally Reviewed  Physical Exam   GEN: No acute distress.   Neck: No JVD Cardiac: RRR, no murmurs, rubs, or gallops.  Respiratory: Clear to auscultation bilaterally. GI: Soft, nontender, non-distended  MS: No edema; No deformity. Neuro:  Nonfocal  Psych: Normal affect   Labs    High Sensitivity Troponin:   Recent Labs  Lab 11/23/22 2025 11/24/22 0722  TROPONINIHS 168* 23,463*     Chemistry Recent Labs  Lab 11/23/22 2025 11/23/22 2031 11/24/22 0722  NA 134* 136 136  K 3.4* 3.4* 4.2  CL 101 100 104  CO2 23  --  23  GLUCOSE 184* 182* 155*  BUN 11 10 8   CREATININE 1.08 1.10 1.30*  CALCIUM 8.5*  --  8.5*  PROT 6.7   --   --   ALBUMIN 3.2*  --   --   AST 24  --   --   ALT 36  --   --   ALKPHOS 56  --   --   BILITOT 0.9  --   --   GFRNONAA >60  --  >60  ANIONGAP 10  --  9    Lipids  Recent Labs  Lab 11/24/22 0722  CHOL 169  TRIG 77  HDL 41  LDLCALC 113*  CHOLHDL 4.1    Hematology Recent Labs  Lab 11/23/22 2025 11/23/22 2031 11/24/22 0722  WBC 10.4  --  6.9  RBC 4.06*  --  4.05*  HGB 13.4 13.3 13.2  HCT 38.5* 39.0 39.2  MCV 94.8  --  96.8  MCH 33.0  --  32.6  MCHC 34.8  --  33.7  RDW 12.7  --  12.8  PLT 262  --  256   Thyroid  Recent Labs  Lab 11/24/22 0722  TSH 1.196    BNPNo results  for input(s): "BNP", "PROBNP" in the last 168 hours.  DDimer No results for input(s): "DDIMER" in the last 168 hours.   Radiology    CARDIAC CATHETERIZATION  Result Date: 11/23/2022   CULPRIT LESION Segment: Prox RCA-1 lesion is 60% stenosed.  Prox RCA-2 lesion is 100% stenosed.  Prox RCA to Mid RCA lesion is 90% stenosed.   A drug-eluting stent was successfully placed covering all 3 lesions, using a STENT ONYX FRONTIER 2.75X30 -> deployed to 2.9 mm.  Post intervention, there is a 0% residual stenosis.   Mid RCA to Dist RCA lesion is 40% stenosed.   ---------------------------------------------------------------   2nd Mrg lesion is 80% stenosed. ->  Consider staged PCI   Prox LAD to Mid LAD lesion is 30% stenosed.   ---------------------------------------------------------------   There is mild left ventricular systolic dysfunction. The left ventricular ejection fraction is 45-50% by visual estimate. LV end diastolic pressure is moderately elevated.   --------------------------------------------------------------. POST-CATH DIAGNOSES Severe two-vessel disease: Culprit lesion is 100% thrombotic occlusion of the RCA between sidebranches; TIMI 0 flow Successful DES PCI of proximal to mid RCA with Synergy XD; TIMI-3 flow restored Proximal 2nd Mrg focal 80% Proximal LAD 30% at SP1 Mildly reduced LVEF with  apparent inferior hypokinesis. EF 45 to 50%. LVEDP 24 mmHg. RECOMMENDATIONS  In the absence of any other complications or medical issues, we expect the patient to be ready for discharge from an interventional cardiology perspective on 11/25/2022.   Will ask to interventional colleagues to review films, consider staged PCI of 2nd Mrg prior to discharge (could be discharged same day if done early enough). Check 2D echo   Recommend uninterrupted dual antiplatelet therapy with Aspirin 81mg  daily and Ticagrelor 90mg  twice daily for a minimum of 12 months (ACS-Class I recommendation).   Continue Thienopyridine SAPT monotherapy for second year.  (Okay to interrupt at that time) High-dose statin-80 mg atorvastatin Consider reinitiating antihypertensives-beta-blocker/ARB once blood pressure stabilizes in the morning. Bryan Lemma, MD   Cardiac Studies     Patient Profile     52 y.o. male with history of HTN admitted 11/23/22 with an acute inferior STEMI secondary to occlusion of the proximal RCA which was treated with a drug eluting stent. Also with severe obtuse marginal stenosis with plans for staged PCI.   Assessment & Plan    CAD/Inferior STEMI: Doing well this am following PCI of the RCA which was the culprit vessel. Troponin over 23,000. Plans for staged PCI of the OM lesion today. He is chest pain free. Will continue DAPT with ASA/Brilinta. Continue statin. Echo pending today.   For questions or updates, please contact [facility] Please consult [email or web address] for contact info under        Signed, Verne Carrow, MD  11/25/2022, 8:35 AM

## 2022-11-26 ENCOUNTER — Encounter (HOSPITAL_COMMUNITY): Payer: Self-pay | Admitting: Cardiology

## 2022-11-26 ENCOUNTER — Inpatient Hospital Stay (HOSPITAL_COMMUNITY): Payer: Medicaid Other

## 2022-11-26 ENCOUNTER — Telehealth: Payer: Self-pay | Admitting: Cardiology

## 2022-11-26 ENCOUNTER — Other Ambulatory Visit (HOSPITAL_COMMUNITY): Payer: Self-pay

## 2022-11-26 DIAGNOSIS — Z8679 Personal history of other diseases of the circulatory system: Secondary | ICD-10-CM

## 2022-11-26 DIAGNOSIS — E785 Hyperlipidemia, unspecified: Secondary | ICD-10-CM | POA: Insufficient documentation

## 2022-11-26 DIAGNOSIS — I2510 Atherosclerotic heart disease of native coronary artery without angina pectoris: Secondary | ICD-10-CM

## 2022-11-26 DIAGNOSIS — I5021 Acute systolic (congestive) heart failure: Secondary | ICD-10-CM | POA: Insufficient documentation

## 2022-11-26 DIAGNOSIS — Z006 Encounter for examination for normal comparison and control in clinical research program: Secondary | ICD-10-CM

## 2022-11-26 DIAGNOSIS — I2119 ST elevation (STEMI) myocardial infarction involving other coronary artery of inferior wall: Secondary | ICD-10-CM | POA: Diagnosis not present

## 2022-11-26 HISTORY — DX: Personal history of other diseases of the circulatory system: Z86.79

## 2022-11-26 HISTORY — DX: Acute systolic (congestive) heart failure: I50.21

## 2022-11-26 LAB — BASIC METABOLIC PANEL
Anion gap: 9 (ref 5–15)
BUN: 13 mg/dL (ref 6–20)
CO2: 27 mmol/L (ref 22–32)
Calcium: 9.1 mg/dL (ref 8.9–10.3)
Chloride: 100 mmol/L (ref 98–111)
Creatinine, Ser: 1.18 mg/dL (ref 0.61–1.24)
GFR, Estimated: >60 mL/min (ref >60)
Glucose, Bld: 124 mg/dL — ABNORMAL HIGH (ref 70–99)
Potassium: 4.3 mmol/L (ref 3.5–5.1)
Sodium: 136 mmol/L (ref 135–145)

## 2022-11-26 LAB — ECHOCARDIOGRAM COMPLETE
AR max vel: 2.55 cm2
AV Area VTI: 2.46 cm2
AV Area mean vel: 2.28 cm2
AV Mean grad: 3 mm[Hg]
AV Peak grad: 5.7 mm[Hg]
Ao pk vel: 1.19 m/s
Area-P 1/2: 3.68 cm2
Height: 67 in
S' Lateral: 3.4 cm
Weight: 2853.63 [oz_av]

## 2022-11-26 LAB — CBC
HCT: 42.5 % (ref 39.0–52.0)
Hemoglobin: 14.5 g/dL (ref 13.0–17.0)
MCH: 33.1 pg (ref 26.0–34.0)
MCHC: 34.1 g/dL (ref 30.0–36.0)
MCV: 97 fL (ref 80.0–100.0)
Platelets: 274 10*3/uL (ref 150–400)
RBC: 4.38 MIL/uL (ref 4.22–5.81)
RDW: 12.8 % (ref 11.5–15.5)
WBC: 7 10*3/uL (ref 4.0–10.5)
nRBC: 0 % (ref 0.0–0.2)

## 2022-11-26 LAB — LIPOPROTEIN A (LPA): Lipoprotein (a): 68.4 nmol/L — ABNORMAL HIGH (ref <75.0)

## 2022-11-26 MED ORDER — ATORVASTATIN CALCIUM 80 MG PO TABS
80.0000 mg | ORAL_TABLET | Freq: Every day | ORAL | 1 refills | Status: DC
  Filled 2022-11-26: qty 90, 90d supply, fill #0

## 2022-11-26 MED ORDER — METOPROLOL SUCCINATE ER 25 MG PO TB24
25.0000 mg | ORAL_TABLET | Freq: Every day | ORAL | 1 refills | Status: DC
  Filled 2022-11-26: qty 30, 30d supply, fill #0

## 2022-11-26 MED ORDER — STUDY - LIBREXIA-ACS - MILVEXIAN 25 MG OR PLACEBO TABLET (PI-STUCKEY)
1.0000 | ORAL_TABLET | Freq: Two times a day (BID) | ORAL | Status: DC
  Administered 2022-11-26: 1 via ORAL
  Filled 2022-11-26: qty 1

## 2022-11-26 MED ORDER — IRBESARTAN 75 MG PO TABS
37.5000 mg | ORAL_TABLET | Freq: Every day | ORAL | Status: DC
  Administered 2022-11-26: 37.5 mg via ORAL
  Filled 2022-11-26: qty 0.5

## 2022-11-26 MED ORDER — NITROGLYCERIN 0.4 MG SL SUBL
0.4000 mg | SUBLINGUAL_TABLET | SUBLINGUAL | 2 refills | Status: DC | PRN
  Filled 2022-11-26: qty 25, 8d supply, fill #0

## 2022-11-26 MED ORDER — TICAGRELOR 90 MG PO TABS
90.0000 mg | ORAL_TABLET | Freq: Two times a day (BID) | ORAL | 2 refills | Status: DC
  Filled 2022-11-26: qty 60, 30d supply, fill #0

## 2022-11-26 MED ORDER — IRBESARTAN 75 MG PO TABS
37.5000 mg | ORAL_TABLET | Freq: Every day | ORAL | 0 refills | Status: DC
  Filled 2022-11-26: qty 15, 30d supply, fill #0

## 2022-11-26 MED ORDER — ASPIRIN 81 MG PO CHEW
81.0000 mg | CHEWABLE_TABLET | Freq: Every day | ORAL | 2 refills | Status: DC
  Filled 2022-11-26: qty 90, 90d supply, fill #0

## 2022-11-26 MED FILL — Nitroglycerin IV Soln 100 MCG/ML in D5W: INTRA_ARTERIAL | Qty: 10 | Status: AC

## 2022-11-26 NOTE — Progress Notes (Signed)
Rounding Note    Patient Name: Wayne Huff Date of Encounter: 11/26/2022  [facility] Cardiologist: None Hard[place]  Subjective   No chest pain or dyspnea. No events overnight  Inpatient Medications    Scheduled Meds:  amLODipine  5 mg Oral Daily   aspirin  81 mg Oral Daily   atorvastatin  80 mg Oral Daily   Chlorhexidine Gluconate Cloth  6 each Topical Daily   sodium chloride flush  3 mL Intravenous Q12H   ticagrelor  90 mg Oral BID   Continuous Infusions:  PRN Meds: acetaminophen, morphine injection, ondansetron (ZOFRAN) IV, mouth rinse, sodium chloride flush, tiZANidine   Vital Signs    Vitals:   11/26/22 0400 11/26/22 0418 11/26/22 0500 11/26/22 0600  BP: 121/81  121/81 106/83  Pulse:      Resp: (!) 22  14 16   Temp:  98.2 F (36.8 C)    TempSrc:  Oral    SpO2: 94%  93% 93%  Weight:      Height:        Intake/Output Summary (Last 24 hours) at 11/26/2022 0826 Last data filed at 11/25/2022 2345 Gross per 24 hour  Intake 826.16 ml  Output --  Net 826.16 ml      11/25/2022    6:00 AM 11/23/2022    9:30 PM 11/23/2022    8:40 PM  Last 3 Weights  Weight (lbs) 178 lb 5.6 oz 184 lb 8.4 oz 185 lb  Weight (kg) 80.9 kg 83.7 kg 83.915 kg      Telemetry   Sinus - Personally Reviewed  ECG    NSR - Personally Reviewed  Physical Exam   General: Well developed, well nourished, NAD  HEENT: OP clear, mucus membranes moist  SKIN: warm, dry. No rashes. Neuro: No focal deficits  Musculoskeletal: Muscle strength 5/5 all ext  Psychiatric: Mood and affect normal  Neck: No JVD, no carotid bruits, no thyromegaly, no lymphadenopathy.  Lungs:Clear bilaterally, no wheezes, rhonci, crackles Cardiovascular: Regular rate and rhythm. No murmurs, gallops or rubs. Abdomen:Soft. Bowel sounds present. Non-tender.  Extremities: No lower extremity edema. Pulses are 2 + in the bilateral DP/PT.  Labs    High Sensitivity Troponin:   Recent Labs  Lab  11/23/22 2025 11/24/22 0722  TROPONINIHS 168* 23,463*     Chemistry Recent Labs  Lab 11/23/22 2025 11/23/22 2031 11/24/22 0722 11/26/22 0300  NA 134* 136 136 136  K 3.4* 3.4* 4.2 4.3  CL 101 100 104 100  CO2 23  --  23 27  GLUCOSE 184* 182* 155* 124*  BUN 11 10 8 13   CREATININE 1.08 1.10 1.30* 1.18  CALCIUM 8.5*  --  8.5* 9.1  PROT 6.7  --   --   --   ALBUMIN 3.2*  --   --   --   AST 24  --   --   --   ALT 36  --   --   --   ALKPHOS 56  --   --   --   BILITOT 0.9  --   --   --   GFRNONAA >60  --  >60 >60  ANIONGAP 10  --  9 9    Lipids  Recent Labs  Lab 11/24/22 0722  CHOL 169  TRIG 77  HDL 41  LDLCALC 113*  CHOLHDL 4.1    Hematology Recent Labs  Lab 11/23/22 2025 11/23/22 2031 11/24/22 0722 11/26/22 0300  WBC 10.4  --  6.9 7.0  RBC 4.06*  --  4.05* 4.38  HGB 13.4 13.3 13.2 14.5  HCT 38.5* 39.0 39.2 42.5  MCV 94.8  --  96.8 97.0  MCH 33.0  --  32.6 33.1  MCHC 34.8  --  33.7 34.1  RDW 12.7  --  12.8 12.8  PLT 262  --  256 274   Thyroid  Recent Labs  Lab 11/24/22 0722  TSH 1.196    BNPNo results for input(s): "BNP", "PROBNP" in the last 168 hours.  DDimer No results for input(s): "DDIMER" in the last 168 hours.   Radiology    CARDIAC CATHETERIZATION  Result Date: 11/25/2022   Prox LAD to Mid LAD lesion is 30% stenosed.   Lesion # 2: 2nd Mrg-1 lesion is 80% stenosed.  2nd Mrg-2 lesion is 60% stenosed.  TIMI-3 flow   A drug-eluting stent was successfully placed crossing both lesions, using a SYNERGY XD 2.25X28 => deployed to 2.5 mm.  Post intervention, there is a 0% residual stenosis in both lesions. TIMI-3 flow maintained RECOMMENDATIONS   In the absence of any other complications or medical issues, we expect the patient to be ready for discharge from an interventional cardiology perspective on 11/26/2022.   Need time to titrate GDMT for CAD   Recommend uninterrupted dual antiplatelet therapy with Aspirin 81mg  daily and Ticagrelor 90mg  twice daily  for a minimum of 12 months (ACS-Class I recommendation).   After 1 year we will stop aspirin and continue Thienopyridine SAPT (Brilinta 60 mg BID or Plavix 75 mg daily) Bryan Lemma, MD   Cardiac Studies     Patient Profile     52 y.o. male with history of HTN admitted 11/23/22 with an acute inferior STEMI secondary to occlusion of the proximal RCA which was treated with a drug eluting stent. Also with severe obtuse marginal stenosis with plans for staged PCI.   Assessment & Plan    CAD/Inferior STEMI: No chest pain this am. Staged PCI of the Circumflex yesterday. Will continue DAPT with ASA and Brilinta. With lack of insurance, will give 30 days Brilinta here then consider changing to Plavix at office follow up. Continue statin. Will change Norvasc to ARB today. Echo pending today.   Possible discharge later today pending echo results.   For questions or updates, please contact [facility] Please consult [email or web address] for contact info under        Signed, Verne Carrow, MD  11/26/2022, 8:26 AM

## 2022-11-26 NOTE — Discharge Summary (Addendum)
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  Prox RCA-2 lesion is 100% stenosed.  Prox RCA to Mid RCA lesion is 90% stenosed.   A drug-eluting stent was successfully placed covering all 3 lesions, using a STENT ONYX FRONTIER 2.75X30 -> deployed to 2.9 mm.  Post intervention, there is a 0% residual stenosis.   Mid RCA to Dist RCA lesion is 40% stenosed.   ---------------------------------------------------------------   2nd Mrg lesion is 80% stenosed. ->  Consider staged PCI   Prox LAD to Mid LAD lesion is 30% stenosed.   ---------------------------------------------------------------   There is mild left ventricular systolic dysfunction. The left ventricular ejection fraction is 45-50% by visual estimate. LV end diastolic pressure is moderately elevated.   --------------------------------------------------------------.   POST-CATH DIAGNOSES Severe two-vessel disease: Culprit lesion is 100% thrombotic occlusion of the RCA between sidebranches; TIMI 0 flow Successful DES PCI of proximal to mid RCA with Synergy XD; TIMI-3 flow restored Proximal 2nd Mrg focal 80% Proximal LAD 30% at SP1 Mildly reduced LVEF with apparent inferior hypokinesis. EF 45 to 50%. LVEDP 24 mmHg.    RECOMMENDATIONS  In the absence of any other complications or medical issues, we expect the patient to be ready for discharge from an interventional cardiology perspective  on 11/25/2022.   Will ask to interventional colleagues to review films, consider staged PCI of 2nd Mrg prior to discharge (could be discharged same day if done early enough). Check 2D echo   Recommend uninterrupted dual antiplatelet therapy with Aspirin 81mg  daily and Ticagrelor 90mg  twice daily for a minimum of 12 months (ACS-Class I recommendation).   Continue Thienopyridine SAPT monotherapy for second year.  (Okay to interrupt at that time) High-dose statin-80 mg atorvastatin Consider reinitiating antihypertensives-beta-blocker/ARB once blood pressure stabilizes in the morning.       Bryan Lemma, MD  Cath: 11/25/2022    Prox LAD to Mid LAD lesion is 30% stenosed.   Lesion # 2: 2nd Mrg-1 lesion is 80% stenosed.  2nd Mrg-2 lesion is 60% stenosed.  TIMI-3 flow   A drug-eluting stent was successfully placed crossing both lesions, using a SYNERGY XD 2.25X28 => deployed to 2.5 mm.  Post intervention, there is a 0% residual stenosis in both lesions. TIMI-3 flow maintained   RECOMMENDATIONS   In the absence of any other complications or medical issues, we expect the patient to be ready for discharge from an interventional cardiology perspective on 11/26/2022.   Need time to titrate GDMT for CAD   Recommend uninterrupted dual antiplatelet therapy with Aspirin 81mg  daily and Ticagrelor 90mg  twice daily for a minimum of 12 months (ACS-Class I recommendation).   After 1 year we will stop aspirin and continue Thienopyridine SAPT (Brilinta 60 mg BID or Plavix 75 mg daily)     Bryan Lemma, MD  Echo: 11/26/2022  IMPRESSIONS     1. Left ventricular ejection fraction, by estimation, is 45 to 50%. The  left ventricle has mildly  decreased function. The left ventricle  demonstrates regional wall motion abnormalities (see scoring  diagram/findings for description). Left ventricular  diastolic parameters are indeterminate. There is akinesis of the left  ventricular, entire inferior wall. There is  hypokinesis of the left  ventricular, entire inferolateral wall. There is hypokinesis of the left  ventricular, basal inferoseptal wall.   2. Right ventricular systolic function is normal. The right ventricular  size is normal. Tricuspid regurgitation signal is inadequate for assessing  PA pressure.   3. The mitral valve is normal in structure. Trivial mitral valve  regurgitation. No evidence of mitral stenosis.   4. The aortic valve is tricuspid. Aortic valve regurgitation is not  visualized. Aortic valve sclerosis/calcification is present, without any  evidence of aortic stenosis. Aortic valve area, by VTI measures 2.46 cm.  Aortic valve mean gradient measures  3.0 mmHg. Aortic valve Vmax measures 1.19 m/s.   5. The inferior vena cava is normal in size with greater than 50%  respiratory variability, suggesting right atrial pressure of 3 mmHg.   6. Recommend repeat limited study with definity contrast as the  inferolateral endocardium is not well visualized.   FINDINGS   Left Ventricle: Left ventricular ejection fraction, by estimation, is 45  to 50%. The left ventricle has mildly decreased function. The left  ventricle demonstrates regional wall motion abnormalities. The left  ventricular internal cavity size was normal  in size. There is no left ventricular hypertrophy. Left ventricular  diastolic parameters are indeterminate. Normal left ventricular filling  pressure.   Right Ventricle: The right ventricular size is normal. No increase in  right ventricular wall thickness. Right ventricular systolic function is  normal. Tricuspid regurgitation signal is inadequate for assessing PA  pressure.   Left Atrium: Left atrial size was normal in size.   Right Atrium: Right atrial size was normal in size.   Pericardium: Trivial pericardial effusion is present. The pericardial  effusion is anterior to the right ventricle and localized near the right  atrium.   Mitral Valve: The  mitral valve is normal in structure. Trivial mitral  valve regurgitation. No evidence of mitral valve stenosis.   Tricuspid Valve: The tricuspid valve is normal in structure. Tricuspid  valve regurgitation is trivial. No evidence of tricuspid stenosis.   Aortic Valve: The aortic valve is tricuspid. Aortic valve regurgitation is  not visualized. Aortic valve sclerosis/calcification is present, without  any evidence of aortic stenosis. Aortic valve mean gradient measures 3.0  mmHg. Aortic valve peak gradient  measures 5.7 mmHg. Aortic valve area, by VTI measures 2.46 cm.   Pulmonic Valve: The pulmonic valve was normal in structure. Pulmonic valve  regurgitation is not visualized. No evidence of pulmonic stenosis.   Aorta: The aortic root is normal in size and structure.   Venous: The inferior vena cava is normal in size with greater than 50%  respiratory variability, suggesting right atrial pressure of 3 mmHg.   IAS/Shunts: No atrial level shunt detected by color flow Doppler.  _____________   History of Present Illness     Wayne Huff is a 52 y.o. male with past medical history of hypertension and back pain who presented with chest pain and found to have an inferior STEMI.  He currently works at [employer], he was at work [employer] whenever he started feeling abnormal.  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  EMS noted inferior STEMI and he was brought to [facility] emergently for cardiac catheterization.  Hospital Course    Inferior STEMI -- Underwent initial cardiac catheterization on 11/16 showing severe two-vessel CAD with culprit lesion being 100% thrombotic occlusion of the RCA between sidebranches treated with PCI/DES x 1.  Did have residual second OM disease of 80% treated in a staged fashion with PCI x 1 on 11/18.  Recommendations for DAPT with aspirin/Brilinta at least 1  year.  He does not currently have insurance but was given 30-day card for Brilinta, will need to consider changing to Plavix at follow-up if unable to obtain his Brilinta. -- Continue aspirin, Brilinta, atorvastatin 80 mg daily, irbesartan 37.5 mg daily, metoprolol 25 mg daily  HFmrEF ICM -- Echocardiogram showed LVEF of 45 to 50%, normal RV, akinesis of the LV, entire inferior wall. -- GDMT: Irbesartan 37.5 mg daily, Toprol XL 25 mg daily  Hyperlipidemia -- LDL 113, HDL 41 -- Atorvastatin 80 mg daily -- LFT/FLP in 8 weeks  Patient was agreeable to enroll in [place] research trial prior to discharge  General: Well developed, well nourished, male appearing in no acute distress. Head: Normocephalic, atraumatic.  Neck: Supple without bruits, JVD. Lungs:  Resp regular and unlabored, CTA. Heart: RRR, S1, S2, no S3, S4, or murmur; no rub. Abdomen: Soft, non-tender, non-distended with normoactive bowel sounds. No hepatomegaly. No rebound/guarding. No obvious abdominal masses. Extremities: No clubbing, cyanosis, edema. Distal pedal pulses are 2+ bilaterally. Right radial cath site stable without bruising or hematoma Neuro: Alert and oriented X 3. Moves all extremities spontaneously. Psych: Normal affect.   Seen by Dr. Clifton James and deemed stable for discharge home.  Follow-up arranged in office.  Medication sent to [facility] pharmacy.  Educated by [employer].D. prior to discharge.  Did the patient have an acute coronary syndrome (MI, NSTEMI, STEMI, etc) this admission?:  Yes                               AHA/ACC ACS Clinical Performance & Quality Measures: Aspirin prescribed? - Yes ADP Receptor Inhibitor (Plavix/Clopidogrel, Brilinta/Ticagrelor or Effient/Prasugrel) prescribed (includes medically managed patients)? - Yes Beta Blocker prescribed? - Yes High Intensity Statin (Lipitor 40-80mg  or Crestor 20-40mg ) prescribed? - Yes EF assessed during THIS hospitalization? - Yes For EF <40%, was ACEI/ARB  prescribed? - Yes For EF <40%, Aldosterone Antagonist (Spironolactone or Eplerenone) prescribed? - Not Applicable (EF >/= 40%) Cardiac Rehab Phase II ordered (including medically managed patients)? - Yes  The patient will be scheduled for a TOC follow up appointment in 10-14 days.  A message has been sent to the [facility] and Scheduling Pool at the office where the patient should be seen for follow up.  _____________  Discharge Vitals Blood pressure 123/82, pulse 75, temperature 98 F (36.7 C), temperature source Oral, resp. rate 11, height 5\' 7"  (1.702 m), weight 80.9 kg, SpO2 93%.  Filed Weights   11/23/22 2040 11/23/22 2130 11/25/22 0600  Weight: 83.9 kg 83.7 kg 80.9 kg    Labs & Radiologic Studies    CBC Recent Labs    11/24/22 0722 11/26/22 0300  WBC 6.9 7.0  HGB 13.2 14.5  HCT 39.2 42.5  MCV 96.8 97.0  PLT 256 274   Basic Metabolic Panel Recent Labs    13/08/65 0722 11/26/22 0300  NA 136 136  K 4.2 4.3  CL 104 100  CO2 23 27  GLUCOSE 155* 124*  BUN 8 13  CREATININE 1.30* 1.18  CALCIUM 8.5* 9.1   Liver Function Tests Recent Labs    11/23/22 2025  AST 24  ALT 36  ALKPHOS 56  BILITOT 0.9  PROT 6.7  ALBUMIN 3.2*   No results for input(s): "LIPASE", "AMYLASE" in the last 72 hours. High Sensitivity Troponin:   Recent Labs  Lab 11/23/22 2025 11/24/22 0722  TROPONINIHS 168* 23,463*    BNP Invalid input(s): "POCBNP" D-Dimer No results for input(s): "DDIMER" in the last 72 hours. Hemoglobin A1C Recent Labs    11/24/22 0722  HGBA1C 6.5*   Fasting Lipid Panel Recent Labs    11/24/22 0722  CHOL 169  HDL 41  LDLCALC 113*  TRIG 77  CHOLHDL 4.1   Thyroid Function Tests Recent Labs    11/24/22 0722  TSH 1.196   _____________  ECHOCARDIOGRAM COMPLETE  Result Date: 11/26/2022    ECHOCARDIOGRAM REPORT   Patient Name:   Wayne Huff Date of Exam: 11/26/2022 Medical Rec #:  [id number]      Height:       67.0 in Accession #:    [id number]      Weight:       178.4 lb Date of Birth:  04-12-1970       BSA:          1.926 m Patient Age:    52 years       BP:           123/82 mmHg Patient Gender: M              HR:           63 bpm. Exam Location:  Inpatient Procedure: 2D Echo, Cardiac Doppler and Color Doppler Indications:    CAD I25.10  History:        Patient has no prior history of Echocardiogram examinations. CAD                 and Previous Myocardial Infarction; Risk Factors:Former Smoker.                 11/25/22-CORONARY STENT INTERVENTION.  Sonographer:    Dondra Prader RVT RCS Referring Phys: 70 DAVID [facility]  Sonographer Comments: Technically challenging study due to limited acoustic windows. IMPRESSIONS  1. Left ventricular ejection fraction, by estimation, is 45 to 50%. The left ventricle has mildly decreased function. The left ventricle demonstrates regional wall motion abnormalities (see scoring diagram/findings for description). Left ventricular diastolic parameters are indeterminate. There is akinesis of the left ventricular, entire inferior wall. There is hypokinesis of the left ventricular, entire inferolateral wall. There is hypokinesis of the left ventricular, basal inferoseptal wall.  2. Right ventricular systolic function is normal. The right ventricular size is normal. Tricuspid regurgitation signal is inadequate for assessing PA pressure.  3. The mitral valve is normal in structure. Trivial mitral valve regurgitation. No evidence of mitral stenosis.  4. The aortic valve is tricuspid. Aortic valve regurgitation is not visualized. Aortic valve sclerosis/calcification is present, without any evidence of aortic stenosis. Aortic valve area, by VTI measures 2.46 cm. Aortic valve mean gradient measures 3.0 mmHg. Aortic valve Vmax measures 1.19 m/s.  5. The inferior vena cava is normal in size with greater than 50% respiratory variability, suggesting right atrial pressure of 3 mmHg.  6. Recommend repeat limited study with definity contrast  as the inferolateral endocardium is not well visualized. FINDINGS  Left Ventricle: Left ventricular ejection fraction, by estimation, is 45  to 50%. The left ventricle has mildly decreased function. The left ventricle demonstrates regional wall motion abnormalities. The left ventricular internal cavity size was normal in size. There is no left ventricular hypertrophy. Left ventricular diastolic parameters are indeterminate. Normal left ventricular filling pressure. Right Ventricle: The right ventricular size is normal. No increase in right ventricular wall thickness. Right ventricular systolic function is normal. Tricuspid regurgitation signal is inadequate for assessing PA pressure. Left Atrium: Left atrial size was normal in size. Right Atrium: Right atrial size was normal in size. Pericardium: Trivial pericardial effusion is present. The pericardial effusion is anterior to the right ventricle and localized near the right atrium. Mitral Valve: The mitral valve is normal in structure. Trivial mitral valve regurgitation. No evidence of mitral valve stenosis. Tricuspid Valve: The tricuspid valve is normal in structure. Tricuspid valve regurgitation is trivial. No evidence of tricuspid stenosis. Aortic Valve: The aortic valve is tricuspid. Aortic valve regurgitation is not visualized. Aortic valve sclerosis/calcification is present, without any evidence of aortic stenosis. Aortic valve mean gradient measures 3.0 mmHg. Aortic valve peak gradient measures 5.7 mmHg. Aortic valve area, by VTI measures 2.46 cm. Pulmonic Valve: The pulmonic valve was normal in structure. Pulmonic valve regurgitation is not visualized. No evidence of pulmonic stenosis. Aorta: The aortic root is normal in size and structure. Venous: The inferior vena cava is normal in size with greater than 50% respiratory variability, suggesting right atrial pressure of 3 mmHg. IAS/Shunts: No atrial level shunt detected by color flow Doppler.  LEFT VENTRICLE  PLAX 2D LVIDd:         4.50 cm   Diastology LVIDs:         3.40 cm   LV e' medial:    7.18 cm/s LV PW:         1.20 cm   LV E/e' medial:  11.5 LV IVS:        0.90 cm   LV e' lateral:   11.70 cm/s LVOT diam:     1.90 cm   LV E/e' lateral: 7.0 LV SV:         51 LV SV Index:   27 LVOT Area:     2.84 cm  RIGHT VENTRICLE             IVC RV S prime:     13.80 cm/s  IVC diam: 1.80 cm TAPSE (M-mode): 1.9 cm LEFT ATRIUM             Index       RIGHT ATRIUM          Index LA diam:        2.60 cm 1.35 cm/m  RA Area:     7.93 cm LA Vol (A2C):   17.3 ml 8.98 ml/m  RA Volume:   18.00 ml 9.35 ml/m LA Vol (A4C):   14.1 ml 7.32 ml/m LA Biplane Vol: 16.1 ml 8.36 ml/m  AORTIC VALVE                    PULMONIC VALVE AV Area (Vmax):    2.55 cm     PV Vmax:       0.95 m/s AV Area (Vmean):   2.28 cm     PV Peak grad:  3.6 mmHg AV Area (VTI):     2.46 cm AV Vmax:           119.00 cm/s AV Vmean:          77.000 cm/s  AV VTI:            0.209 m AV Peak Grad:      5.7 mmHg AV Mean Grad:      3.0 mmHg LVOT Vmax:         107.00 cm/s LVOT Vmean:        61.800 cm/s LVOT VTI:          0.181 m LVOT/AV VTI ratio: 0.87  AORTA Ao Root diam: 3.20 cm Ao Asc diam:  3.20 cm MITRAL VALVE MV Area (PHT): 3.68 cm    SHUNTS MV Decel Time: 206 msec    Systemic VTI:  0.18 m MV E velocity: 82.30 cm/s  Systemic Diam: 1.90 cm MV A velocity: 87.00 cm/s MV E/A ratio:  0.95 Armanda Magic MD Electronically signed by Armanda Magic MD Signature Date/Time: 11/26/2022/10:10:27 AM    Final    CARDIAC CATHETERIZATION  Result Date: 11/25/2022   Prox LAD to Mid LAD lesion is 30% stenosed.   Lesion # 2: 2nd Mrg-1 lesion is 80% stenosed.  2nd Mrg-2 lesion is 60% stenosed.  TIMI-3 flow   A drug-eluting stent was successfully placed crossing both lesions, using a SYNERGY XD 2.25X28 => deployed to 2.5 mm.  Post intervention, there is a 0% residual stenosis in both lesions. TIMI-3 flow maintained RECOMMENDATIONS   In the absence of any other complications or medical  issues, we expect the patient to be ready for discharge from an interventional cardiology perspective on 11/26/2022.   Need time to titrate GDMT for CAD   Recommend uninterrupted dual antiplatelet therapy with Aspirin 81mg  daily and Ticagrelor 90mg  twice daily for a minimum of 12 months (ACS-Class I recommendation).   After 1 year we will stop aspirin and continue Thienopyridine SAPT (Brilinta 60 mg BID or Plavix 75 mg daily) Bryan Lemma, MD  CARDIAC CATHETERIZATION  Result Date: 11/23/2022   CULPRIT LESION Segment: Prox RCA-1 lesion is 60% stenosed.  Prox RCA-2 lesion is 100% stenosed.  Prox RCA to Mid RCA lesion is 90% stenosed.   A drug-eluting stent was successfully placed covering all 3 lesions, using a STENT ONYX FRONTIER 2.75X30 -> deployed to 2.9 mm.  Post intervention, there is a 0% residual stenosis.   Mid RCA to Dist RCA lesion is 40% stenosed.   ---------------------------------------------------------------   2nd Mrg lesion is 80% stenosed. ->  Consider staged PCI   Prox LAD to Mid LAD lesion is 30% stenosed.   ---------------------------------------------------------------   There is mild left ventricular systolic dysfunction. The left ventricular ejection fraction is 45-50% by visual estimate. LV end diastolic pressure is moderately elevated.   --------------------------------------------------------------. POST-CATH DIAGNOSES Severe two-vessel disease: Culprit lesion is 100% thrombotic occlusion of the RCA between sidebranches; TIMI 0 flow Successful DES PCI of proximal to mid RCA with Synergy XD; TIMI-3 flow restored Proximal 2nd Mrg focal 80% Proximal LAD 30% at SP1 Mildly reduced LVEF with apparent inferior hypokinesis. EF 45 to 50%. LVEDP 24 mmHg. RECOMMENDATIONS  In the absence of any other complications or medical issues, we expect the patient to be ready for discharge from an interventional cardiology perspective on 11/25/2022.   Will ask to interventional colleagues to review films,  consider staged PCI of 2nd Mrg prior to discharge (could be discharged same day if done early enough). Check 2D echo   Recommend uninterrupted dual antiplatelet therapy with Aspirin 81mg  daily and Ticagrelor 90mg  twice daily for a minimum of 12 months (ACS-Class I recommendation).   Continue Thienopyridine SAPT monotherapy  for second year.  (Okay to interrupt at that time) High-dose statin-80 mg atorvastatin Consider reinitiating antihypertensives-beta-blocker/ARB once blood pressure stabilizes in the morning. Bryan Lemma, MD  Disposition   Pt is being discharged home today in good condition.  Follow-up Plans & Appointments     Discharge Instructions     Amb Referral to Cardiac Rehabilitation   Complete by: As directed    Diagnosis:  Coronary Stents STEMI     After initial evaluation and assessments completed: Virtual Based Care may be provided alone or in conjunction with Phase 2 Cardiac Rehab based on patient barriers.: Yes   Intensive Cardiac Rehabilitation (ICR) [facility] location only OR Traditional Cardiac Rehabilitation (TCR) *If criteria for ICR are not met will enroll in TCR [facility] only): Yes   Diet - low sodium heart healthy   Complete by: As directed    Discharge instructions   Complete by: As directed    Radial Site Care Refer to this sheet in the next few weeks. These instructions provide you with information on caring for yourself after your procedure. Your caregiver may also give you more specific instructions. Your treatment has been planned according to current medical practices, but problems sometimes occur. Call your caregiver if you have any problems or questions after your procedure. HOME CARE INSTRUCTIONS You may shower the day after the procedure. Remove the bandage (dressing) and gently wash the site with plain soap and water. Gently pat the site dry.  Do not apply powder or lotion to the site.  Do not submerge the affected site in water for 3 to 5 days.  Inspect the  site at least twice daily.  Do not flex or bend the affected arm for 24 hours.  No lifting over 5 pounds (2.3 kg) for 5 days after your procedure.  Do not drive home if you are discharged the same day of the procedure. Have someone else drive you.  You may drive 24 hours after the procedure unless otherwise instructed by your caregiver.  What to expect: Any bruising will usually fade within 1 to 2 weeks.  Blood that collects in the tissue (hematoma) may be painful to the touch. It should usually decrease in size and tenderness within 1 to 2 weeks.  SEEK IMMEDIATE MEDICAL CARE IF: You have unusual pain at the radial site.  You have redness, warmth, swelling, or pain at the radial site.  You have drainage (other than a small amount of blood on the dressing).  You have chills.  You have a fever or persistent symptoms for more than 72 hours.  You have a fever and your symptoms suddenly get worse.  Your arm becomes pale, cool, tingly, or numb.  You have heavy bleeding from the site. Hold pressure on the site.   PLEASE DO NOT MISS ANY DOSES OF YOUR BRILINTA!!!!! Also keep a log of you blood pressures and bring back to your follow up appt. Please call the office with any questions.   Patients taking blood thinners should generally stay away from medicines like ibuprofen, Advil, Motrin, naproxen, and Aleve due to risk of stomach bleeding. You may take Tylenol as directed or talk to your primary doctor about alternatives.   PLEASE ENSURE THAT YOU DO NOT RUN OUT OF YOUR BRILINTA. This medication is very important to remain on for at least one year. IF you have issues obtaining this medication due to cost please CALL the office 3-5 business days prior to running out in order to prevent  missing doses of this medication.   Increase activity slowly   Complete by: As directed         Discharge Medications   Allergies as of 11/26/2022   No Known Allergies      Medication List     STOP  taking these medications    amLODipine 5 MG tablet Commonly known as: NORVASC       TAKE these medications    aspirin 81 MG chewable tablet Chew 1 tablet (81 mg total) by mouth daily. Start taking on: November 27, 2022   atorvastatin 80 MG tablet Commonly known as: LIPITOR Take 1 tablet (80 mg total) by mouth daily. Start taking on: November 27, 2022   irbesartan 75 MG tablet Commonly known as: AVAPRO Take 0.5 tablets (37.5 mg total) by mouth daily. Start taking on: November 27, 2022   LIBREXIA-ACS milvexian or placebo 25 mg tablet Take 1 tablet by mouth 2 (two) times daily. For investigational use. Take with or without food two times daily. Bring bottle back to every visit.   metoprolol succinate 25 MG 24 hr tablet Commonly known as: Toprol XL Take 1 tablet (25 mg total) by mouth daily.   nitroGLYCERIN 0.4 MG SL tablet Commonly known as: NITROSTAT Place 1 tablet (0.4 mg total) under the tongue every 5 (five) minutes as needed.   ticagrelor 90 MG Tabs tablet Commonly known as: BRILINTA Take 1 tablet (90 mg total) by mouth 2 (two) times daily.         Outstanding Labs/Studies   FLP/LFTs in 8 weeks BMET at follow up  Duration of Discharge Encounter   Greater than 30 minutes including physician time.  Signed, Laverda Page, NP 11/26/2022, 12:14 PM   I have personally seen and examined this patient. I agree with the assessment and plan as outlined above.  See my full note this am.   Verne Carrow, MD, [facility] 11/26/2022 1:26 PM

## 2022-11-26 NOTE — Research (Cosign Needed Addendum)
[facility] ACS Informed Consent   Subject Name: Wayne Huff  Subject met inclusion and exclusion criteria.  The informed consent form, study requirements and expectations were reviewed with the subject and questions and concerns were addressed prior to the signing of the consent form.  The subject verbalized understanding of the trial requirements.  The subject agreed to participate in the [place] ACS trial and signed the informed consent at 0930 on 11/26/2022.  The informed consent was obtained prior to performance of any protocol-specific procedures for the subject.  A copy of the signed informed consent was given to the subject and a copy was placed in the subject's medical record.       Were all eligibility criteria met? [x] Yes [] No   Pregnancy Test Was the sample collected? [] Yes [] No [x] N/A Was the collection date the same as the visit date? [] Yes [] No Specimen Type: [] Serum [] Urine Collection Date: Collection Time: Pregnancy Test Result: [] Positive [] Negative [] Borderline [] Invalid   Randomization What was the date of randomization?:26-Nov-2022 What was the time of randomization?: 10:29 PD sampling Value: PK Sampling Value:   [facility] Hematology/Chemistry drawn: [x] Yes [] No   Study Drug Administration Was dose administered?  [x] Yes [] No Time dose administered: 1125  Physical Exam Was the physical examination performed? [x] Yes [] No Was the examination date the same as the visit date? [x] Yes [] No   EQ-5D-5L Assessment Completed? [x] Yes [] No Reason not done: [] Subject Forgot [] Subject too ill [] Subject refused [] Technical failure [] Other If Other, Specify Collection Date 26-Nov-2022   Subject ID card given: [x] Yes [] No   Are there any labs that are clinically significant?  Yes []  OR No[]   Is the patient eligible to continue enrollment in the study after screening visit?  Yes [x]  OR No[] 

## 2022-11-26 NOTE — Plan of Care (Signed)
  Problem: Education: Goal: Knowledge of General Education information will improve Description: Including pain rating scale, medication(s)/side effects and non-pharmacologic comfort measures Outcome: Adequate for Discharge   Problem: Health Behavior/Discharge Planning: Goal: Ability to manage health-related needs will improve Outcome: Adequate for Discharge   Problem: Clinical Measurements: Goal: Ability to maintain clinical measurements within normal limits will improve Outcome: Adequate for Discharge Goal: Will remain free from infection Outcome: Adequate for Discharge Goal: Diagnostic test results will improve Outcome: Adequate for Discharge Goal: Respiratory complications will improve Outcome: Adequate for Discharge Goal: Cardiovascular complication will be avoided Outcome: Adequate for Discharge   Problem: Activity: Goal: Risk for activity intolerance will decrease Outcome: Adequate for Discharge   Problem: Nutrition: Goal: Adequate nutrition will be maintained Outcome: Adequate for Discharge   Problem: Coping: Goal: Level of anxiety will decrease Outcome: Adequate for Discharge   Problem: Elimination: Goal: Will not experience complications related to bowel motility Outcome: Adequate for Discharge Goal: Will not experience complications related to urinary retention Outcome: Adequate for Discharge   Problem: Pain Management: Goal: General experience of comfort will improve Outcome: Adequate for Discharge   Problem: Safety: Goal: Ability to remain free from injury will improve Outcome: Adequate for Discharge   Problem: Skin Integrity: Goal: Risk for impaired skin integrity will decrease Outcome: Adequate for Discharge   Problem: Education: Goal: Understanding of CV disease, CV risk reduction, and recovery process will improve Outcome: Adequate for Discharge Goal: Individualized Educational Video(s) Outcome: Adequate for Discharge   Problem:  Activity: Goal: Ability to return to baseline activity level will improve Outcome: Adequate for Discharge   Problem: Cardiovascular: Goal: Ability to achieve and maintain adequate cardiovascular perfusion will improve Outcome: Adequate for Discharge Goal: Vascular access site(s) Level 0-1 will be maintained Outcome: Adequate for Discharge   Problem: Health Behavior/Discharge Planning: Goal: Ability to safely manage health-related needs after discharge will improve Outcome: Adequate for Discharge

## 2022-11-26 NOTE — Progress Notes (Signed)
Talked to pt about rehab and helped answer questions about diet.   Wayne Huff 11/26/2022 12:16 PM

## 2022-11-26 NOTE — Progress Notes (Signed)
Patient was seen earlier and findings discussed with him.  He presented with IMI and underwent stenting of the RCA, with elective FU stenting of a second vessel.  He is doing well, and his cath stable appears stable.    I reviewed with him the trial, its purpose, and the risks.  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   He meets inclusion and exclusion criteria for Librex[place] trial, and desires to participate.  I explained the potential for increasing bleeding risk, the randomization that is part of this trial so that he could be receiving placebo, and Amy Landry Mellow jointly explained to him the need to take his medications and follow up.    Arturo Morton. Riley Kill, MD Program Lead, [facility]

## 2022-11-26 NOTE — Progress Notes (Signed)
Patient became sinus tach with activity.  HR slowed back down after resting.  Not complaining of chest pain, dizziness, or weakness. BP normal.

## 2022-11-26 NOTE — Telephone Encounter (Signed)
   Transition of Care Follow-up Phone Call Request    Patient Name: Wayne Huff Date of Birth: 1970/12/24 Date of Encounter: 11/26/2022  Primary Care Provider:  Patient, No Pcp Per Primary Cardiologist:  Verne Carrow, MD  Wayne Huff has been scheduled for a transition of care follow up appointment with a [facility] provider:  Robet Leu 11/26  Please reach out to Wayne Huff within 48 hours of discharge to confirm appointment and review transition of care protocol questionnaire. Anticipated discharge date: 11/19  Laverda Page, NP  11/26/2022, 11:47 AM

## 2022-11-26 NOTE — Telephone Encounter (Signed)
Patient identification verified by 2 forms. Marilynn Rail, RN    Patient contacted regarding discharge from [facility] on 11/26/22.  Patient understands to follow up with provider PA Johnson on 12/03/22 at 1:55pm at [facility]. Patient understands discharge instructions? Yes  Patient understands medications and regiment? Yes  Patient understands to bring all medications to this visit? Yes   Patient states:   -would need assistance with short term disability  Informed patient:  -Obtain paperwork for disbility   -present paperwork to 11/26 appointment  Patient verbalized understanding, no questions at this time

## 2022-11-26 NOTE — Progress Notes (Signed)
*  PRELIMINARY RESULTS* Echocardiogram 2D Echocardiogram has been performed.  Earlie Server Tiberius Loftus 11/26/2022, 9:25 AM

## 2022-11-28 ENCOUNTER — Telehealth (HOSPITAL_COMMUNITY): Payer: Self-pay

## 2022-11-28 NOTE — Progress Notes (Signed)
Cardiology Office Note:  .   Date:  12/03/2022  ID:  Wayne Huff, DOB [birth year], MRN [id number] PCP: Patient, No Pcp Per  [facility] Providers Cardiologist:  Verne Carrow, MD   History of Present Illness: .   Wayne Huff is a 52 y.o. male with a past medical history of STEMI, HLD, HFrEF. Patient followed by Dr. Clifton James and presents today for STEMI follow up   Patient recently admitted from 11/16-11/19 with STEMI. Underwent cardiac catheterization on 11/16 that showed severe two-vessel disease. Culprit lesion was 100% thrombotic occlusion of the RCA between sidebranches that was treated with DES PCI of the proximal to mid RCA. There was also focal 80% stenosis in the 2nd marginal. Underwent staged PCI on 11/18 with successful DES placement covering 2 lesions in the 2nd marginal. Underwent echocardiogram on 11/19 that showed EF 45-50% with regional wall motion abnormalities, normal RV function. Patient was discharged on ASA 81 mg daily, lipitor 80 mg daily, irbesartan 37.5 mg daily, metoprolol succinate 25 mg daily, Brilinta 90 mg BID. Also discharged on librexia.   Today, patient presents for follow up after his STEMI. Reports that he has overall been doing well since he was discharged. He has started to walk around his house and outside. Does not have chest pain when he exerts himself. Reports having rare and brief episodes of chest pain that occur when he is at rest. Pain feels like heart-burn and is located on the left side of his chest. Pain is very brief, reportedly lasts less than 1 second at a time. He has not had any chest pain similar to what he felt when he had his heart attack. He reports some shortness of breath on exertion. Shortness of breath was present prior to when he had his heart attack, but it seems to have worsened since his admission. He denies orthopnea, ankle edema, cough. Denies dizziness, syncope, near syncope.   The patient has a history of smoking but  quit in May. The patient also reports a history of constipation and a recent episode of rectal bleeding, which was brief and has not recurred.   The patient is currently on multiple medications, including aspirin, Brilinta, irbesartan, and metoprolol. The patient reports adherence to these medications but has not been using prescribed inhalers. The patient also inquires about the potential interaction of his medications with a medication for erectile dysfunction.  The patient is eager to return to work and resume physical activity. The patient also expresses interest in a cooking class to learn about heart-healthy eating and a cardiac rehabilitation program.  ROS: Per HPI   Studies Reviewed: .   Cardiac Studies & Procedures   CARDIAC CATHETERIZATION  CARDIAC CATHETERIZATION 11/25/2022  Narrative   Prox LAD to Mid LAD lesion is 30% stenosed.   Lesion # 2: 2nd Mrg-1 lesion is 80% stenosed.  2nd Mrg-2 lesion is 60% stenosed.  TIMI-3 flow   A drug-eluting stent was successfully placed crossing both lesions, using a SYNERGY XD 2.25X28 => deployed to 2.5 mm.  Post intervention, there is a 0% residual stenosis in both lesions. TIMI-3 flow maintained  RECOMMENDATIONS   In the absence of any other complications or medical issues, we expect the patient to be ready for discharge from an interventional cardiology perspective on 11/26/2022.   Need time to titrate GDMT for CAD   Recommend uninterrupted dual antiplatelet therapy with Aspirin 81mg  daily and Ticagrelor 90mg  twice daily for a minimum of 12 months (ACS-Class I  recommendation).   After 1 year we will stop aspirin and continue Thienopyridine SAPT (Brilinta 60 mg BID or Plavix 75 mg daily)   Bryan Lemma, MD  Findings Coronary Findings Diagnostic  Dominance: Right  Left Anterior Descending Prox LAD to Mid LAD lesion is 30% stenosed. The lesion is segmental and eccentric.  First Diagonal Branch Vessel is small in size.  Left  Circumflex  First Obtuse Marginal Branch Vessel is small in size.  Second Obtuse Marginal Branch Vessel is moderate in size. 2nd Mrg-1 lesion is 80% stenosed. Vessel is not the culprit lesion. The lesion is type A, located at the bend, segmental and concentric. 2nd Mrg-2 lesion is 60% stenosed.  Right Coronary Artery Vessel was not injected. Non-stenotic Prox RCA-1 lesion was previously treated. Non-stenotic Prox RCA-2 lesion was previously treated. Vessel is the culprit lesion. The lesion is located proximal to the major branch, segmental and thrombotic. Non-stenotic Prox RCA to Mid RCA lesion was previously treated. Mid RCA to Dist RCA lesion is 40% stenosed.  Right Ventricular Branch Vessel is small in size.  Right Posterior Atrioventricular Artery Vessel is small in size.  First Right Posterolateral Branch Vessel is small in size.  Second Right Posterolateral Branch Vessel is small in size.  Intervention  2nd Mrg-1 lesion Stent (Also treats lesions: 2nd Mrg-2) Lesion length:  26 mm. CATH LAUNCHER 6FR EBU3.5 guide catheter was inserted. Lesion crossed with guidewire using a WIRE ASAHI PROWATER 180CM. Pre-stent angioplasty was performed using a BALLN EMERGE MR 2.0X12. Maximum pressure:  10 atm. Inflation time: 20 sec. A drug-eluting stent was successfully placed using a SYNERGY XD 2.25X28. Same stent covers both lesions Maximum pressure: 12 atm. Inflation time: 30 sec. Stent strut is well apposed. Stent covers both lesions Post-stent angioplasty was performed. Maximum pressure:  16 atm. Inflation time:  20 sec. Stent balloon-high ATM Post-Intervention Lesion Assessment The intervention was successful. Pre-interventional TIMI flow is 3. Post-intervention TIMI flow is 3. Treated lesion length:  26 mm. No complications occurred at this lesion. There is a 0% residual stenosis post intervention.  2nd Mrg-2 lesion Stent (Also treats lesions: 2nd Mrg-1) See details in 2nd Mrg-1  lesion. Post-Intervention Lesion Assessment The intervention was successful. Pre-interventional TIMI flow is 3. Post-intervention TIMI flow is 3. Treated lesion length:  26 mm. No complications occurred at this lesion. There is a 0% residual stenosis post intervention.   CARDIAC CATHETERIZATION 11/23/2022  Narrative   CULPRIT LESION Segment: Prox RCA-1 lesion is 60% stenosed.  Prox RCA-2 lesion is 100% stenosed.  Prox RCA to Mid RCA lesion is 90% stenosed.   A drug-eluting stent was successfully placed covering all 3 lesions, using a STENT ONYX FRONTIER 2.75X30 -> deployed to 2.9 mm.  Post intervention, there is a 0% residual stenosis.   Mid RCA to Dist RCA lesion is 40% stenosed.   ---------------------------------------------------------------   2nd Mrg lesion is 80% stenosed. ->  Consider staged PCI   Prox LAD to Mid LAD lesion is 30% stenosed.   ---------------------------------------------------------------   There is mild left ventricular systolic dysfunction. The left ventricular ejection fraction is 45-50% by visual estimate. LV end diastolic pressure is moderately elevated.   --------------------------------------------------------------.  POST-CATH DIAGNOSES Severe two-vessel disease: Culprit lesion is 100% thrombotic occlusion of the RCA between sidebranches; TIMI 0 flow Successful DES PCI of proximal to mid RCA with Synergy XD; TIMI-3 flow restored Proximal 2nd Mrg focal 80% Proximal LAD 30% at SP1 Mildly reduced LVEF with apparent inferior hypokinesis. EF 45  to 50%. LVEDP 24 mmHg.  RECOMMENDATIONS In the absence of any other complications or medical issues, we expect the patient to be ready for discharge from an interventional cardiology perspective on 11/25/2022.   Will ask to interventional colleagues to review films, consider staged PCI of 2nd Mrg prior to discharge (could be discharged same day if done early enough). Check 2D echo   Recommend uninterrupted dual  antiplatelet therapy with Aspirin 81mg  daily and Ticagrelor 90mg  twice daily for a minimum of 12 months (ACS-Class I recommendation).   Continue Thienopyridine SAPT monotherapy for second year.  (Okay to interrupt at that time) High-dose statin-80 mg atorvastatin Consider reinitiating antihypertensives-beta-blocker/ARB once blood pressure stabilizes in the morning.    Bryan Lemma, MD  Findings Coronary Findings Diagnostic  Dominance: Right  Left Anterior Descending Prox LAD to Mid LAD lesion is 30% stenosed. The lesion is segmental and eccentric.  First Diagonal Branch Vessel is small in size.  Left Circumflex  First Obtuse Marginal Branch Vessel is small in size.  Second Obtuse Marginal Branch Vessel is moderate in size. 2nd Mrg lesion is 80% stenosed.  Right Coronary Artery Prox RCA-1 lesion is 60% stenosed. Prox RCA-2 lesion is 100% stenosed. Vessel is the culprit lesion. The lesion is located proximal to the major branch, segmental and thrombotic. Prox RCA to Mid RCA lesion is 90% stenosed. Mid RCA to Dist RCA lesion is 40% stenosed.  Right Ventricular Branch Vessel is small in size.  Right Posterior Atrioventricular Artery Vessel is small in size.  First Right Posterolateral Branch Vessel is small in size.  Second Right Posterolateral Branch Vessel is small in size.  Intervention  Prox RCA-1 lesion Stent (Also treats lesions: Prox RCA-2, and Prox RCA to Mid RCA) Lesion length:  29 mm. CATH VISTA GUIDE 6FR JR4 guide catheter was inserted. Lesion crossed with guidewire using a WIRE ASAHI PROWATER 180CM. Pre-stent angioplasty was performed using a BALLN SAPPHIRE 2.5X12. Maximum pressure:  10 atm. Inflation time:  20 sec. A drug-eluting stent was successfully placed using a STENT ONYX FRONTIER 2.75X30. Maximum pressure: 14 atm. Inflation time: 30 sec. Stent strut is well apposed. Post-stent angioplasty was performed. Maximum pressure:  20 atm. Inflation time:  15  sec. Stent balloon to high ATM-deployed to 2.9 mm Stent covers all 3 lesions Post-Intervention Lesion Assessment The intervention was successful. Pre-interventional TIMI flow is 0. Post-intervention TIMI flow is 3. No complications occurred at this lesion. There is a 0% residual stenosis post intervention.  Prox RCA-2 lesion Stent (Also treats lesions: Prox RCA-1, and Prox RCA to Mid RCA) See details in Prox RCA-1 lesion. Post-Intervention Lesion Assessment The intervention was successful. Pre-interventional TIMI flow is 0. Post-intervention TIMI flow is 3. Treated lesion length:  30 mm. No complications occurred at this lesion. There is a 0% residual stenosis post intervention.  Prox RCA to Mid RCA lesion Stent (Also treats lesions: Prox RCA-1, and Prox RCA-2) See details in Prox RCA-1 lesion. Post-Intervention Lesion Assessment The intervention was successful. Pre-interventional TIMI flow is 0. Post-intervention TIMI flow is 3. Treated lesion length:  30 mm. No complications occurred at this lesion. There is a 0% residual stenosis post intervention.     ECHOCARDIOGRAM  ECHOCARDIOGRAM COMPLETE 11/26/2022  Narrative ECHOCARDIOGRAM REPORT    Patient Name:   SAIFULLAH MARTELLO Date of Exam: 11/26/2022 Medical Rec #:  [id number]      Height:       67.0 in Accession #:    [id number]  Weight:       178.4 lb Date of Birth:  01/15/1970       BSA:          1.926 m Patient Age:    52 years       BP:           123/82 mmHg Patient Gender: M              HR:           63 bpm. Exam Location:  Inpatient  Procedure: 2D Echo, Cardiac Doppler and Color Doppler  Indications:    CAD I25.10  History:        Patient has no prior history of Echocardiogram examinations. CAD and Previous Myocardial Infarction; Risk Factors:Former Smoker. 11/25/22-CORONARY STENT INTERVENTION.  Sonographer:    Dondra Prader RVT RCS Referring Phys: 10 DAVID W [facility]   Sonographer Comments: Technically challenging  study due to limited acoustic windows. IMPRESSIONS   1. Left ventricular ejection fraction, by estimation, is 45 to 50%. The left ventricle has mildly decreased function. The left ventricle demonstrates regional wall motion abnormalities (see scoring diagram/findings for description). Left ventricular diastolic parameters are indeterminate. There is akinesis of the left ventricular, entire inferior wall. There is hypokinesis of the left ventricular, entire inferolateral wall. There is hypokinesis of the left ventricular, basal inferoseptal wall. 2. Right ventricular systolic function is normal. The right ventricular size is normal. Tricuspid regurgitation signal is inadequate for assessing PA pressure. 3. The mitral valve is normal in structure. Trivial mitral valve regurgitation. No evidence of mitral stenosis. 4. The aortic valve is tricuspid. Aortic valve regurgitation is not visualized. Aortic valve sclerosis/calcification is present, without any evidence of aortic stenosis. Aortic valve area, by VTI measures 2.46 cm. Aortic valve mean gradient measures 3.0 mmHg. Aortic valve Vmax measures 1.19 m/s. 5. The inferior vena cava is normal in size with greater than 50% respiratory variability, suggesting right atrial pressure of 3 mmHg. 6. Recommend repeat limited study with definity contrast as the inferolateral endocardium is not well visualized.  FINDINGS Left Ventricle: Left ventricular ejection fraction, by estimation, is 45 to 50%. The left ventricle has mildly decreased function. The left ventricle demonstrates regional wall motion abnormalities. The left ventricular internal cavity size was normal in size. There is no left ventricular hypertrophy. Left ventricular diastolic parameters are indeterminate. Normal left ventricular filling pressure.  Right Ventricle: The right ventricular size is normal. No increase in right ventricular wall thickness. Right ventricular systolic function is  normal. Tricuspid regurgitation signal is inadequate for assessing PA pressure.  Left Atrium: Left atrial size was normal in size.  Right Atrium: Right atrial size was normal in size.  Pericardium: Trivial pericardial effusion is present. The pericardial effusion is anterior to the right ventricle and localized near the right atrium.  Mitral Valve: The mitral valve is normal in structure. Trivial mitral valve regurgitation. No evidence of mitral valve stenosis.  Tricuspid Valve: The tricuspid valve is normal in structure. Tricuspid valve regurgitation is trivial. No evidence of tricuspid stenosis.  Aortic Valve: The aortic valve is tricuspid. Aortic valve regurgitation is not visualized. Aortic valve sclerosis/calcification is present, without any evidence of aortic stenosis. Aortic valve mean gradient measures 3.0 mmHg. Aortic valve peak gradient measures 5.7 mmHg. Aortic valve area, by VTI measures 2.46 cm.  Pulmonic Valve: The pulmonic valve was normal in structure. Pulmonic valve regurgitation is not visualized. No evidence of pulmonic stenosis.  Aorta: The aortic root is normal in  size and structure.  Venous: The inferior vena cava is normal in size with greater than 50% respiratory variability, suggesting right atrial pressure of 3 mmHg.  IAS/Shunts: No atrial level shunt detected by color flow Doppler.   LEFT VENTRICLE PLAX 2D LVIDd:         4.50 cm   Diastology LVIDs:         3.40 cm   LV e' medial:    7.18 cm/s LV PW:         1.20 cm   LV E/e' medial:  11.5 LV IVS:        0.90 cm   LV e' lateral:   11.70 cm/s LVOT diam:     1.90 cm   LV E/e' lateral: 7.0 LV SV:         51 LV SV Index:   27 LVOT Area:     2.84 cm   RIGHT VENTRICLE             IVC RV S prime:     13.80 cm/s  IVC diam: 1.80 cm TAPSE (M-mode): 1.9 cm  LEFT ATRIUM             Index       RIGHT ATRIUM          Index LA diam:        2.60 cm 1.35 cm/m  RA Area:     7.93 cm LA Vol (A2C):   17.3 ml 8.98  ml/m  RA Volume:   18.00 ml 9.35 ml/m LA Vol (A4C):   14.1 ml 7.32 ml/m LA Biplane Vol: 16.1 ml 8.36 ml/m AORTIC VALVE                    PULMONIC VALVE AV Area (Vmax):    2.55 cm     PV Vmax:       0.95 m/s AV Area (Vmean):   2.28 cm     PV Peak grad:  3.6 mmHg AV Area (VTI):     2.46 cm AV Vmax:           119.00 cm/s AV Vmean:          77.000 cm/s AV VTI:            0.209 m AV Peak Grad:      5.7 mmHg AV Mean Grad:      3.0 mmHg LVOT Vmax:         107.00 cm/s LVOT Vmean:        61.800 cm/s LVOT VTI:          0.181 m LVOT/AV VTI ratio: 0.87  AORTA Ao Root diam: 3.20 cm Ao Asc diam:  3.20 cm  MITRAL VALVE MV Area (PHT): 3.68 cm    SHUNTS MV Decel Time: 206 msec    Systemic VTI:  0.18 m MV E velocity: 82.30 cm/s  Systemic Diam: 1.90 cm MV A velocity: 87.00 cm/s MV E/A ratio:  0.95  Armanda Magic MD Electronically signed by Armanda Magic MD Signature Date/Time: 11/26/2022/10:10:27 AM    Final             Risk Assessment/Calculations:             Physical Exam:   VS:  BP 128/80 (BP Location: Right Arm, Patient Position: Sitting, Cuff Size: Normal)   Pulse 67   Ht 5\' 7"  (1.702 m)   Wt 185 lb 12.8 oz (84.3 kg)   SpO2 98%  BMI 29.10 kg/m    Wt Readings from Last 3 Encounters:  12/03/22 185 lb 12.8 oz (84.3 kg)  11/25/22 178 lb 5.6 oz (80.9 kg)  02/20/21 160 lb (72.6 kg)    GEN: Well nourished, well developed in no acute distress. Sitting comfortably on the exam table  NECK: No JVD  CARDIAC:  RRR, no murmurs, rubs, gallops. Radial pulses 2+ bilaterally  RESPIRATORY:  Clear to auscultation without rales, wheezing or rhonchi  ABDOMEN: Soft, non-tender, non-distended EXTREMITIES:  No edema; No deformity   ASSESSMENT AND PLAN: .    STEMI - Admitted with STEMI on 11/16. Cath showed severe two-vessel disease. Culprit lesion was 100% thrombotic occlusion of the RCA between sidebranches that was treated with DES PCI of the proximal to mid RCA. There was also  focal 80% stenosis in the 2nd marginal. Underwent staged PCI on 11/18 with successful DES placement covering 2 lesions in the 2nd marginal. - Patient reports doing well from a cardiac perspective since his MI. Has rare, brief episodes of chest pain that last less than 1 second at a time. Denies chest pain on exertion  - EKG today is without ischemic changes   - Right radial cath site is soft, nontender. Healing as expected  - Continue lipitor 80 mg daily  - Continue DAPT with ASA, Brilinta. Patient had one episode of rectal bleeding after a large bowel movement. No bleeding otherwise. Stressed importance of compliance with DAPT  - Ordered CBC for monitoring  - Continue metoprolol succinate 25 mg daily  - Patient has prescription for SL nitroglycerin. Discussed proper use of SL nitroglycerin. Also educated patient that you can not take SL nitroglycerin within 24 hours of taking medications for erectile dysfunction   Ischemic Cardiomyopathy  - Echocardiogram from 11/19 showed EF 45-50% with regional wall motion abnormalities, normal RV function  - Discharged on metoprolol succinate 25 mg daily, irebesartan 37.5 mg daily. Tolerating meds well   - Patient euvolemic on exam. - Start spironolactone 12.5 mg daily  - Ordered BMP today and CMP to be collected in 3 weeks after start of spironolactone   HLD  - Lipid panel from 11/2022 showed LDL 113, HDL 41, lipoprotein (a) 68 - Continue lipitor 80 mg daily  - Ordered lipid panel and CMP to be collected in 3 weeks   HTN  - BP well controlled. He denies symptoms of orthostatic hypotension  - Continue irbesartan 75 mg daily, metoprolol succinate 25 mg daily  - Start spironolactone 12.5 mg as above     Cardiac Rehabilitation Eligibility Assessment  The patient is ready to start cardiac rehabilitation from a cardiac standpoint.       Dispo: Follow up in 6 weeks with APP   Signed, Jonita Albee, PA-C

## 2022-11-28 NOTE — Telephone Encounter (Signed)
Called patient to see if he is interested in the Cardiac Rehab Program. Patient expressed interest. Explained scheduling process and went over insurance, patient verbalized understanding. Will contact patient for scheduling once f/u has been completed.  °

## 2022-11-28 NOTE — Telephone Encounter (Signed)
Pt insurance is active and benefits verified through Medicaid. Co-pay $0.00, DED $0.00/$0.00 met, out of pocket $0.00/$0.00 met, co-insurance 0%. No pre-authorization required. Passport, 11/28/22 @ 2:26PM , [phone]   How many CR sessions are covered? 36 visits for TCR Is this a lifetime maximum or an annual maximum? Annual Has the member used any of these services to date? No Is there a time limit (weeks/months) on start of program and/or program completion? No     Will contact patient to see if he is interested in the Cardiac Rehab Program. If interested, patient will need to complete follow up appt. Once completed, patient will be contacted for scheduling upon review by the RN Navigator.

## 2022-11-29 NOTE — Research (Cosign Needed Addendum)
Are there any labs that are clinically significant?  Yes []  OR No[]  ? ?Is the patient eligible to continue enrollment in the study after screening visit?  ?Yes []   OR No[]   ? ? ? ? ? ? ? ? ?

## 2022-12-02 ENCOUNTER — Telehealth: Payer: Self-pay | Admitting: Cardiovascular Disease

## 2022-12-02 NOTE — Telephone Encounter (Signed)
Patient is calling because he recently had a surgery. Patient stated when he went to use the bathroom, he noticed a little blood when wiping. Patient would like to know if he should be concerned. Please advise.

## 2022-12-02 NOTE — Telephone Encounter (Signed)
Spoke with pt who reports he noted a small amount of bright red blood on tissue this morning after having a bowel movement.  Pt states he has been constipated and did strain to have BM.  Pt has not had any additional bleeding.  Pt states he is taking medications as prescribed. Pt advised to try increasing his water intake as well as high fiber foods to help with constipation and to continue to monitor for bleeding.  Reviewed ED precautions.  Pt verbalizes understanding and agrees with current plan.

## 2022-12-03 ENCOUNTER — Ambulatory Visit: Payer: Medicaid Other | Attending: Cardiology | Admitting: Cardiology

## 2022-12-03 ENCOUNTER — Encounter: Payer: Self-pay | Admitting: Cardiology

## 2022-12-03 VITALS — BP 128/80 | HR 67 | Ht 67.0 in | Wt 185.8 lb

## 2022-12-03 DIAGNOSIS — Z79899 Other long term (current) drug therapy: Secondary | ICD-10-CM | POA: Diagnosis not present

## 2022-12-03 DIAGNOSIS — I2119 ST elevation (STEMI) myocardial infarction involving other coronary artery of inferior wall: Secondary | ICD-10-CM | POA: Diagnosis not present

## 2022-12-03 DIAGNOSIS — I255 Ischemic cardiomyopathy: Secondary | ICD-10-CM

## 2022-12-03 DIAGNOSIS — E782 Mixed hyperlipidemia: Secondary | ICD-10-CM

## 2022-12-03 DIAGNOSIS — I10 Essential (primary) hypertension: Secondary | ICD-10-CM

## 2022-12-03 MED ORDER — SPIRONOLACTONE 25 MG PO TABS: 12.5000 mg | ORAL_TABLET | Freq: Every day | ORAL | 3 refills | Status: DC

## 2022-12-03 NOTE — Patient Instructions (Addendum)
Medication Instructions:  Your physician has recommended you make the following change in your medication:  START: Spirolactone 12.5 mg once daily *If you need a refill on your cardiac medications before your next appointment, please call your pharmacy*   Lab Work: TODAY: BMP, CBC In 3 weeks (please come fasting) :  CMP, Lipid If you have labs (blood work) drawn today and your tests are completely normal, you will receive your results only by: MyChart Message (if you have MyChart) OR A paper copy in the mail If you have any lab test that is abnormal or we need to change your treatment, we will call you to review the results.   Testing/Procedures: None   Follow-Up: At [facility], you and your health needs are our priority.  As part of our continuing mission to provide you with exceptional heart care, we have created designated Provider Care Teams.  These Care Teams include your primary Cardiologist (physician) and Advanced Practice Providers (APPs -  Physician Assistants and Nurse Practitioners) who all work together to provide you with the care you need, when you need it.  We recommend signing up for the patient portal called "MyChart".  Sign up information is provided on this After Visit Summary.  MyChart is used to connect with patients for Virtual Visits (Telemedicine).  Patients are able to view lab/test results, encounter notes, upcoming appointments, etc.  Non-urgent messages can be sent to your provider as well.   To learn more about what you can do with MyChart, go to [email or web address].    Your next appointment:   6-8 week(s)  Provider:   An APP

## 2022-12-04 ENCOUNTER — Telehealth: Payer: Self-pay

## 2022-12-04 ENCOUNTER — Telehealth: Payer: Self-pay | Admitting: *Deleted

## 2022-12-04 LAB — CBC WITH DIFFERENTIAL/PLATELET
Basophils Absolute: 0.1 x10E3/uL (ref 0.0–0.2)
Basos: 1 %
EOS (ABSOLUTE): 0.1 x10E3/uL (ref 0.0–0.4)
Eos: 1 %
Hematocrit: 42.4 % (ref 37.5–51.0)
Hemoglobin: 14.2 g/dL (ref 13.0–17.7)
Immature Grans (Abs): 0.1 x10E3/uL (ref 0.0–0.1)
Immature Granulocytes: 1 %
Lymphocytes Absolute: 1.9 x10E3/uL (ref 0.7–3.1)
Lymphs: 23 %
MCH: 32.9 pg (ref 26.6–33.0)
MCHC: 33.5 g/dL (ref 31.5–35.7)
MCV: 98 fL — ABNORMAL HIGH (ref 79–97)
Monocytes Absolute: 0.9 x10E3/uL (ref 0.1–0.9)
Monocytes: 12 %
Neutrophils Absolute: 4.9 x10E3/uL (ref 1.4–7.0)
Neutrophils: 62 %
Platelets: 297 x10E3/uL (ref 150–450)
RBC: 4.31 x10E6/uL (ref 4.14–5.80)
RDW: 12.3 % (ref 11.6–15.4)
WBC: 7.9 x10E3/uL (ref 3.4–10.8)

## 2022-12-04 LAB — BASIC METABOLIC PANEL WITH GFR
BUN/Creatinine Ratio: 15 (ref 9–20)
BUN: 15 mg/dL (ref 6–24)
CO2: 22 mmol/L (ref 20–29)
Calcium: 9.4 mg/dL (ref 8.7–10.2)
Chloride: 100 mmol/L (ref 96–106)
Creatinine, Ser: 1.01 mg/dL (ref 0.76–1.27)
Glucose: 101 mg/dL — ABNORMAL HIGH (ref 70–99)
Potassium: 4.7 mmol/L (ref 3.5–5.2)
Sodium: 140 mmol/L (ref 134–144)
eGFR: 90 mL/min/1.73

## 2022-12-04 NOTE — Telephone Encounter (Signed)
Received cardiac rehab form to be completed/signed.  It came to [name] attention.  Dr. Clifton James reviewed chart and advises that the patient is actually [name].    Follow up should be with [name] going forward and I will send the cardiac rehab form to [name] for signature.

## 2022-12-04 NOTE — Telephone Encounter (Signed)
-----   Message from Jonita Albee sent at 12/04/2022  8:58 AM EST ----- Please tell patient that his lab work yesterday was overall normal- kidneys are functioning well. Electrolytes were all within normal limits. WBC, RBC, hemoglobin, and platelets all normal. This tells [place] that his body is tolerating his medications well.   Remind him to get BMP completed in 3 weeks (was already ordered yesterday)   Thanks KJ

## 2022-12-04 NOTE — Telephone Encounter (Signed)
Called patient advised of below they verbalized understanding.

## 2022-12-16 ENCOUNTER — Telehealth: Payer: Self-pay | Admitting: Cardiology

## 2022-12-16 ENCOUNTER — Encounter: Payer: Self-pay | Admitting: *Deleted

## 2022-12-16 NOTE — Telephone Encounter (Signed)
Spoke with pt, aware of the recommendations. Letter generated and placed at the front desk for pick up.

## 2022-12-16 NOTE — Telephone Encounter (Signed)
Patient would like to get a release letter to return to work with lifting restrictions included.

## 2022-12-16 NOTE — Telephone Encounter (Signed)
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 He occ will have to lift 50 lbs. He is needing letter okay to return to work and how much he is clear to lift. Aware Nicholos Johns is working in the hospital today but will forward for her review. He would like to pick up the letter when available.

## 2022-12-23 ENCOUNTER — Telehealth (HOSPITAL_COMMUNITY): Payer: Self-pay

## 2022-12-23 ENCOUNTER — Encounter (HOSPITAL_COMMUNITY): Payer: Self-pay

## 2022-12-23 NOTE — Telephone Encounter (Signed)
Attempted to call patient in regards to Cardiac Rehab - LM on VM Mailed letter 

## 2022-12-25 ENCOUNTER — Telehealth: Payer: Self-pay | Admitting: *Deleted

## 2022-12-25 ENCOUNTER — Other Ambulatory Visit: Payer: Self-pay | Admitting: Cardiology

## 2022-12-25 ENCOUNTER — Other Ambulatory Visit (HOSPITAL_COMMUNITY): Payer: Self-pay

## 2022-12-25 ENCOUNTER — Other Ambulatory Visit (HOSPITAL_BASED_OUTPATIENT_CLINIC_OR_DEPARTMENT_OTHER): Payer: Self-pay

## 2022-12-25 VITALS — BP 140/71 | HR 51

## 2022-12-25 DIAGNOSIS — Z006 Encounter for examination for normal comparison and control in clinical research program: Secondary | ICD-10-CM

## 2022-12-25 MED ORDER — METOPROLOL SUCCINATE ER 25 MG PO TB24
25.0000 mg | ORAL_TABLET | Freq: Every day | ORAL | 1 refills | Status: DC
  Filled 2022-12-25: qty 30, 30d supply, fill #0

## 2022-12-25 MED ORDER — IRBESARTAN 75 MG PO TABS
37.5000 mg | ORAL_TABLET | Freq: Every day | ORAL | 0 refills | Status: DC
  Filled 2022-12-25: qty 15, 30d supply, fill #0

## 2022-12-25 MED ORDER — TICAGRELOR 90 MG PO TABS
90.0000 mg | ORAL_TABLET | Freq: Two times a day (BID) | ORAL | 2 refills | Status: DC
  Filled 2022-12-25: qty 180, 90d supply, fill #0

## 2022-12-25 MED ORDER — CLOPIDOGREL BISULFATE 75 MG PO TABS
75.0000 mg | ORAL_TABLET | Freq: Every day | ORAL | 3 refills | Status: DC
  Filled 2022-12-25: qty 33, 30d supply, fill #0
  Filled 2022-12-25: qty 30, 30d supply, fill #0

## 2022-12-25 MED ORDER — STUDY - LIBREXIA-ACS - MILVEXIAN 25 MG OR PLACEBO TABLET (PI-STUCKEY): 1.0000 | ORAL_TABLET | Freq: Two times a day (BID) | ORAL | 0 refills | Status: DC

## 2022-12-25 NOTE — Telephone Encounter (Signed)
Message from Logan, research RN to Dr. Clifton James: hey Dr. Clifton James, I'm with this pt right now in a research appt and he is almost entirely out of his brilinta. he is having insurance problems and we are unable to find him any samples. could he be switched to plavix in the meantime?   Kathleene Hazel, MD yes, we can change him to Plavix. He will need a  Plavix load 300 mg and then 75 mg daily. Midas Daughety, can you send this in for him? This is the patient that will follow up with Herbie Baltimore.  _______________________________________________________   Rhina Brackett plavix prescription to [facility] pharmacy.  Research RN will review loading dose of 300 mg after finishing Brilinta.    Scheduled his follow up per last ov notes.  Appt info given to patient by Research RN.

## 2022-12-25 NOTE — Research (Addendum)
 [facility] ACS 4 WEEK VISIT  Labs Reviewed   Debby BIRCH. Morris, MD, [facility]  Central Labs Hematology/Chemistry drawn: [x] Yes [] No   PK/PD Biomarkers drawn: [x] Yes [] No Subject took study drug around 0630 this morning.   EQ-5D-5L Assessment Completed? [x] Yes [] No Reason not done: [] Subject Forgot [] Subject too ill [] Subject refused [] Technical failure [] Other If Other, Specify Collection Date: 18/Dec/2024   Medication Kit Accountability Dispensed Status [x] Dispensed [] Dispensed In Error [] Not Dispensed If 'Not Dispensed', provide reason: [] Medical Decision [] Medication Kit not available or damaged [] Study medication discontinued [] Visit skipped or administration skipped [] Medication kit dispensed in error Date Dispensed: 18/Dec/2024 Amount Dispensed:  Medication Number [id number] 70 tablets Medication Number [id number] 70 tablets  Medication Number [id number]            70 tablets  Return Status: [] Damaged/Returned by subject [] Missing/Not returned by subject [x] Returned by subject If not returned reasons: [] Forgot [] Lost Date Returned: 18/Dec/2024 Amount Returned:  6 tablets  [id number] (Milvexian) 25mg  or Placebo - [id number]   Compliance%: 91   Were any suspected endpoint events or adverse events experienced? [x] Yes [] No PI Stuckey, MD saw and examined patient for complaints of bloody stool x2 in November. Subject reports problem has resolved with no other complications and attributes to a low fiber diet. Morris, MD identified that subject is still safe to continue IP at this time.   Current Outpatient Medications:    aspirin  81 MG chewable tablet, Chew 1 tablet (81 mg total) by mouth daily., Disp: 90 tablet, Rfl: 2   atorvastatin  (LIPITOR) 80 MG tablet, Take 1 tablet (80 mg total) by mouth daily., Disp: 90 tablet, Rfl: 1   nitroGLYCERIN  (NITROSTAT ) 0.4 MG SL tablet, Place 1 tablet (0.4 mg total) under the tongue every 5 (five) minutes as needed., Disp: 25 tablet, Rfl: 2    spironolactone  (ALDACTONE ) 25 MG tablet, Take 0.5 tablets (12.5 mg total) by mouth daily., Disp: 45 tablet, Rfl: 3   clopidogrel  (PLAVIX ) 75 MG tablet, Take 4 tablets (300mg ) as a single dose on first day, then starting on day 2, take 1 tablet (75mg ) by mouth daily., Disp: 90 tablet, Rfl: 3   irbesartan  (AVAPRO ) 75 MG tablet, Take 1/2 tablet (37.5 mg total) by mouth daily., Disp: 90 tablet, Rfl: 0   metoprolol  succinate (TOPROL  XL) 25 MG 24 hr tablet, Take 1 tablet (25 mg total) by mouth daily., Disp: 90 tablet, Rfl: 1   Study - LIBREXIA-ACS - milvexian 25 mg or placebo tablet (PI-Stuckey), Take 1 tablet by mouth 2 (two) times daily. For investigational use. Take with or without food two times daily. Bring bottle back to every visit., Disp: 210 tablet, Rfl: 0   Subject reports being almost out of Brilinta . Verlin, MD notified and informed patient to stop taking Brilinta  and start Plavix  tonight with loading dose of 300 mg. Subject extensively educated on stopping Brilinta  and starting Plavix  tonight with loading dose and resuming 75 mg daily thereafter. Subject educated on the importance of antiplatelet meds and to get them filled before they run out.   Are there any labs that are clinically significant?  Yes []  OR No[x] 

## 2022-12-26 ENCOUNTER — Telehealth: Payer: Self-pay

## 2022-12-26 LAB — COMPREHENSIVE METABOLIC PANEL
ALT: 32 [IU]/L (ref 0–44)
AST: 19 [IU]/L (ref 0–40)
Albumin: 4.3 g/dL (ref 3.8–4.9)
Alkaline Phosphatase: 100 [IU]/L (ref 44–121)
BUN/Creatinine Ratio: 8 — ABNORMAL LOW (ref 9–20)
BUN: 10 mg/dL (ref 6–24)
Bilirubin Total: 0.8 mg/dL (ref 0.0–1.2)
CO2: 24 mmol/L (ref 20–29)
Calcium: 9.4 mg/dL (ref 8.7–10.2)
Chloride: 99 mmol/L (ref 96–106)
Creatinine, Ser: 1.21 mg/dL (ref 0.76–1.27)
Globulin, Total: 2.6 g/dL (ref 1.5–4.5)
Glucose: 177 mg/dL — ABNORMAL HIGH (ref 70–99)
Potassium: 4.6 mmol/L (ref 3.5–5.2)
Sodium: 140 mmol/L (ref 134–144)
Total Protein: 6.9 g/dL (ref 6.0–8.5)
eGFR: 72 mL/min/{1.73_m2} (ref >59)

## 2022-12-26 LAB — LIPID PANEL
Chol/HDL Ratio: 2.7 {ratio} (ref 0.0–5.0)
Cholesterol, Total: 111 mg/dL (ref 100–199)
HDL: 41 mg/dL (ref >39)
LDL Chol Calc (NIH): 51 mg/dL (ref 0–99)
Triglycerides: 102 mg/dL (ref 0–149)
VLDL Cholesterol Cal: 19 mg/dL (ref 5–40)

## 2022-12-26 NOTE — Telephone Encounter (Signed)
-----   Message from Jonita Albee sent at 12/26/2022  4:09 PM EST ----- Please tell patient that his lab work showed normal kidney function, normal electrolytes, normal liver function on his current medications.  LDL (bad cholesterol) well controlled at 51.   Continue current medications and follow up as arranged.   Thanks [employer]

## 2022-12-26 NOTE — Telephone Encounter (Signed)
Unable to leave message no voicemail 

## 2022-12-27 NOTE — Telephone Encounter (Signed)
Unable to leave message

## 2022-12-30 NOTE — Telephone Encounter (Signed)
Unable to leave message

## 2023-01-02 NOTE — Telephone Encounter (Signed)
-----   Message from Jonita Albee sent at 12/26/2022  4:09 PM EST ----- Please tell patient that his lab work showed normal kidney function, normal electrolytes, normal liver function on his current medications.  LDL (bad cholesterol) well controlled at 51.   Continue current medications and follow up as arranged.   Thanks [employer]

## 2023-01-02 NOTE — Telephone Encounter (Signed)
Called patient advised of below they verbalized understanding Reminded him of appointment on January 16 th with Micah Flesher PA-C

## 2023-01-07 ENCOUNTER — Telehealth (HOSPITAL_COMMUNITY): Payer: Self-pay

## 2023-01-07 NOTE — Telephone Encounter (Signed)
 No response from in regards to CR.  Closed referral

## 2023-01-08 ENCOUNTER — Telehealth (HOSPITAL_COMMUNITY): Payer: Self-pay | Admitting: *Deleted

## 2023-01-08 ENCOUNTER — Encounter (HOSPITAL_COMMUNITY): Payer: Self-pay

## 2023-01-08 ENCOUNTER — Ambulatory Visit (HOSPITAL_COMMUNITY)
Admission: EM | Admit: 2023-01-08 | Discharge: 2023-01-08 | Disposition: A | Discharge status: Home / Self Care | Payer: Medicaid Other | Attending: Sports Medicine | Admitting: Sports Medicine

## 2023-01-08 DIAGNOSIS — M5416 Radiculopathy, lumbar region: Secondary | ICD-10-CM

## 2023-01-08 MED ORDER — TIZANIDINE HCL 4 MG PO TABS: 4.0000 mg | ORAL_TABLET | Freq: Four times a day (QID) | ORAL | 0 refills | Status: DC | PRN

## 2023-01-08 MED ORDER — METHYLPREDNISOLONE SODIUM SUCC 125 MG IJ SOLR
INTRAMUSCULAR | Status: AC
  Filled 2023-01-08: qty 2

## 2023-01-08 MED ORDER — METHYLPREDNISOLONE SODIUM SUCC 125 MG IJ SOLR
80.0000 mg | Freq: Once | INTRAMUSCULAR | Status: AC
  Administered 2023-01-08: 80 mg via INTRAMUSCULAR

## 2023-01-08 MED ORDER — KETOROLAC TROMETHAMINE 30 MG/ML IJ SOLN
INTRAMUSCULAR | Status: AC
  Filled 2023-01-08: qty 1

## 2023-01-08 MED ORDER — KETOROLAC TROMETHAMINE 30 MG/ML IJ SOLN
30.0000 mg | Freq: Once | INTRAMUSCULAR | Status: AC
  Administered 2023-01-08: 30 mg via INTRAMUSCULAR

## 2023-01-08 MED ORDER — TIZANIDINE HCL 4 MG PO TABS
4.0000 mg | ORAL_TABLET | Freq: Four times a day (QID) | ORAL | 0 refills | Status: DC | PRN
  Filled 2023-01-08: qty 30, 8d supply, fill #0
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
## 2023-01-08 NOTE — Telephone Encounter (Signed)
 Rx resent to [facility]

## 2023-01-08 NOTE — Discharge Instructions (Addendum)
 Please follow-up with Dr. Eldonna at [facility] for further discussion about spine injections. You will need to call his office at  [phone] for an appointment.     You were given a steroid injection at the urgent care today, which should help with some of the symptoms.  Zanaflex  to be taken as directed to help with muscle spasms.Do not drive or operate heavy machinery with this medication.

## 2023-01-09 ENCOUNTER — Other Ambulatory Visit (HOSPITAL_COMMUNITY): Payer: Self-pay

## 2023-01-09 ENCOUNTER — Telehealth: Payer: Self-pay | Admitting: Physical Medicine and Rehabilitation

## 2023-01-09 NOTE — Telephone Encounter (Signed)
 Patient called and wants to get an appointment for his back injection.[phone]

## 2023-01-15 ENCOUNTER — Encounter: Payer: Self-pay | Admitting: Physical Medicine and Rehabilitation

## 2023-01-15 ENCOUNTER — Ambulatory Visit (INDEPENDENT_AMBULATORY_CARE_PROVIDER_SITE_OTHER): Payer: Medicaid Other | Admitting: Physical Medicine and Rehabilitation

## 2023-01-15 DIAGNOSIS — G8929 Other chronic pain: Secondary | ICD-10-CM

## 2023-01-15 DIAGNOSIS — M5416 Radiculopathy, lumbar region: Secondary | ICD-10-CM

## 2023-01-15 DIAGNOSIS — M5441 Lumbago with sciatica, right side: Secondary | ICD-10-CM

## 2023-01-15 DIAGNOSIS — M5442 Lumbago with sciatica, left side: Secondary | ICD-10-CM | POA: Diagnosis not present

## 2023-01-15 DIAGNOSIS — M47816 Spondylosis without myelopathy or radiculopathy, lumbar region: Secondary | ICD-10-CM | POA: Diagnosis not present
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
## 2023-01-20 NOTE — Progress Notes (Signed)
Cardiology Office Note:    Date:  01/23/2023   ID:  Wayne Huff, DOB November 09, 1970, MRN [id number]  PCP:  Patient, No Pcp Per   [facility] Providers Cardiologist:  Bryan Lemma, MD     Referring MD: No ref. provider found   Chief Complaint  Patient presents with   Follow-up    CAD, STEMI    History of Present Illness:    Wayne Huff is a 53 y.o. male with a hx of CAD s/p STEMI, hyperlipidemia, chronic systolic heart failure, and hyperlipidemia.  He was admitted 11/2022 with STEMI.  Heart catheterization 11/23/2022 showed severe two-vessel disease with the culprit lesion felt to be 100% thrombotic occlusion of the RCA between sidebranches that was treated with a long DES 2.75 x 30 mm of the proximal to mid RCA.  There was also 80% focal stenosis in the second marginal and he underwent staged PCI in 11/25/2022 with DES 2.25 x 28 mm covering 2 lesions in the second marginal.  Echocardiogram 11/26/2022 showed LVEF 45-50% with RWMA, normal RV.  When seen in follow up on 12/03/22, he reported discomfort felt related to GERD. He presents today with a friend. He continues to have issues with GERD after acidic food. He drinks a lot of mountain dew and eats unhealthy foods, per the patient. 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 No angina, SOB, lower extremity swelling.   Past Medical History:  Diagnosis Date   Arthritis    Back pain     Past Surgical History:  Procedure Laterality Date   CORONARY STENT INTERVENTION N/A 11/25/2022   Procedure: CORONARY STENT INTERVENTION;  Surgeon: Marykay Lex, MD;  Location: [facility] INVASIVE CV LAB;  Service: Cardiovascular;  Laterality: N/A;   CORONARY/GRAFT ACUTE MI REVASCULARIZATION N/A 11/23/2022   Procedure: Coronary/Graft Acute MI Revascularization;  Surgeon: Marykay Lex, MD;  Location: [facility] INVASIVE CV LAB;  Service: Cardiovascular;  Laterality: N/A;   ELBOW SURGERY     LEFT HEART CATH AND CORONARY ANGIOGRAPHY N/A 11/23/2022    Procedure: LEFT HEART CATH AND CORONARY ANGIOGRAPHY;  Surgeon: Marykay Lex, MD;  Location: [facility] INVASIVE CV LAB;  Service: Cardiovascular;  Laterality: N/A;    Current Medications: Current Meds  Medication Sig   aspirin 81 MG chewable tablet Chew 1 tablet (81 mg total) by mouth daily.   atorvastatin (LIPITOR) 80 MG tablet Take 1 tablet (80 mg total) by mouth daily.   clopidogrel (PLAVIX) 75 MG tablet Take 4 tablets (300mg ) as a single dose on first day, then starting on day 2, take 1 tablet (75mg ) by mouth daily.   irbesartan (AVAPRO) 75 MG tablet Take 1/2 tablet (37.5 mg total) by mouth daily.   metoprolol succinate (TOPROL XL) 25 MG 24 hr tablet Take 1 tablet (25 mg total) by mouth daily.   nitroGLYCERIN (NITROSTAT) 0.4 MG SL tablet Place 1 tablet (0.4 mg total) under the tongue every 5 (five) minutes as needed.   spironolactone (ALDACTONE) 25 MG tablet Take 0.5 tablets (12.5 mg total) by mouth daily.   Study - LIBREXIA-ACS - milvexian 25 mg or placebo tablet (PI-Stuckey) Take 1 tablet by mouth 2 (two) times daily. For investigational use. Take with or without food two times daily. Bring bottle back to every visit.   tiZANidine (ZANAFLEX) 4 MG tablet Take 1 tablet (4 mg total) by mouth every 6 (six) hours as needed for muscle spasms.     Allergies:   Patient has no known  allergies.   Social History   Socioeconomic History   Marital status: Divorced    Spouse name: Not on file   Number of children: Not on file   Years of education: Not on file   Highest education level: Not on file  Occupational History   Not on file  Tobacco Use   Smoking status: Former    Current packs/day: 0.00    Types: Cigarettes    Quit date: 04/2022    Years since quitting: 0.7   Smokeless tobacco: Never  Vaping Use   Vaping status: Every Day  Substance and Sexual Activity   Alcohol use: No   Drug use: No   Sexual activity: Yes    Partners: Female  Other Topics Concern   Not on file  Social  History Narrative   Not on file   Social Drivers of Health   Financial Resource Strain: Not on file  Food Insecurity: Food Insecurity Present (11/24/2022)   Hunger Vital Sign    Worried About Running Out of Food in the Last Year: Often true    Ran Out of Food in the Last Year: Often true  Transportation Needs: No Transportation Needs (11/24/2022)   PRAPARE - Administrator, Civil Service (Medical): No    Lack of Transportation (Non-Medical): No  Physical Activity: Not on file  Stress: Not on file  Social Connections: Not on file     Family History: The patient's family history is not on file.  ROS:   Please see the history of present illness.     All other systems reviewed and are negative.  EKGs/Labs/Other Studies Reviewed:    The following studies were reviewed today:  Cardiac Studies & Procedures   CARDIAC CATHETERIZATION  CARDIAC CATHETERIZATION 11/25/2022  Narrative   Prox LAD to Mid LAD lesion is 30% stenosed.   Lesion # 2: 2nd Mrg-1 lesion is 80% stenosed.  2nd Mrg-2 lesion is 60% stenosed.  TIMI-3 flow   A drug-eluting stent was successfully placed crossing both lesions, using a SYNERGY XD 2.25X28 => deployed to 2.5 mm.  Post intervention, there is a 0% residual stenosis in both lesions. TIMI-3 flow maintained  RECOMMENDATIONS   In the absence of any other complications or medical issues, we expect the patient to be ready for discharge from an interventional cardiology perspective on 11/26/2022.   Need time to titrate GDMT for CAD   Recommend uninterrupted dual antiplatelet therapy with Aspirin 81mg  daily and Ticagrelor 90mg  twice daily for a minimum of 12 months (ACS-Class I recommendation).   After 1 year we will stop aspirin and continue Thienopyridine SAPT (Brilinta 60 mg BID or Plavix 75 mg daily)   Bryan Lemma, MD  Findings Coronary Findings Diagnostic  Dominance: Right  Left Anterior Descending Prox LAD to Mid LAD lesion is 30%  stenosed. The lesion is segmental and eccentric.  First Diagonal Branch Vessel is small in size.  Left Circumflex  First Obtuse Marginal Branch Vessel is small in size.  Second Obtuse Marginal Branch Vessel is moderate in size. 2nd Mrg-1 lesion is 80% stenosed. Vessel is not the culprit lesion. The lesion is type A, located at the bend, segmental and concentric. 2nd Mrg-2 lesion is 60% stenosed.  Right Coronary Artery Vessel was not injected. Non-stenotic Prox RCA-1 lesion was previously treated. Non-stenotic Prox RCA-2 lesion was previously treated. Vessel is the culprit lesion. The lesion is located proximal to the major branch, segmental and thrombotic. Non-stenotic Prox RCA to Mid  RCA lesion was previously treated. Mid RCA to Dist RCA lesion is 40% stenosed.  Right Ventricular Branch Vessel is small in size.  Right Posterior Atrioventricular Artery Vessel is small in size.  First Right Posterolateral Branch Vessel is small in size.  Second Right Posterolateral Branch Vessel is small in size.  Intervention  2nd Mrg-1 lesion Stent (Also treats lesions: 2nd Mrg-2) Lesion length:  26 mm. CATH LAUNCHER 6FR EBU3.5 guide catheter was inserted. Lesion crossed with guidewire using a WIRE ASAHI PROWATER 180CM. Pre-stent angioplasty was performed using a BALLN EMERGE MR 2.0X12. Maximum pressure:  10 atm. Inflation time: 20 sec. A drug-eluting stent was successfully placed using a SYNERGY XD 2.25X28. Same stent covers both lesions Maximum pressure: 12 atm. Inflation time: 30 sec. Stent strut is well apposed. Stent covers both lesions Post-stent angioplasty was performed. Maximum pressure:  16 atm. Inflation time:  20 sec. Stent balloon-high ATM Post-Intervention Lesion Assessment The intervention was successful. Pre-interventional TIMI flow is 3. Post-intervention TIMI flow is 3. Treated lesion length:  26 mm. No complications occurred at this lesion. There is a 0% residual stenosis  post intervention.  2nd Mrg-2 lesion Stent (Also treats lesions: 2nd Mrg-1) See details in 2nd Mrg-1 lesion. Post-Intervention Lesion Assessment The intervention was successful. Pre-interventional TIMI flow is 3. Post-intervention TIMI flow is 3. Treated lesion length:  26 mm. No complications occurred at this lesion. There is a 0% residual stenosis post intervention.   CARDIAC CATHETERIZATION 11/23/2022  Narrative   CULPRIT LESION Segment: Prox RCA-1 lesion is 60% stenosed.  Prox RCA-2 lesion is 100% stenosed.  Prox RCA to Mid RCA lesion is 90% stenosed.   A drug-eluting stent was successfully placed covering all 3 lesions, using a STENT ONYX FRONTIER 2.75X30 -> deployed to 2.9 mm.  Post intervention, there is a 0% residual stenosis.   Mid RCA to Dist RCA lesion is 40% stenosed.   ---------------------------------------------------------------   2nd Mrg lesion is 80% stenosed. ->  Consider staged PCI   Prox LAD to Mid LAD lesion is 30% stenosed.   ---------------------------------------------------------------   There is mild left ventricular systolic dysfunction. The left ventricular ejection fraction is 45-50% by visual estimate. LV end diastolic pressure is moderately elevated.   --------------------------------------------------------------.  POST-CATH DIAGNOSES Severe two-vessel disease: Culprit lesion is 100% thrombotic occlusion of the RCA between sidebranches; TIMI 0 flow Successful DES PCI of proximal to mid RCA with Synergy XD; TIMI-3 flow restored Proximal 2nd Mrg focal 80% Proximal LAD 30% at SP1 Mildly reduced LVEF with apparent inferior hypokinesis. EF 45 to 50%. LVEDP 24 mmHg.  RECOMMENDATIONS In the absence of any other complications or medical issues, we expect the patient to be ready for discharge from an interventional cardiology perspective on 11/25/2022.   Will ask to interventional colleagues to review films, consider staged PCI of 2nd Mrg prior to discharge  (could be discharged same day if done early enough). Check 2D echo   Recommend uninterrupted dual antiplatelet therapy with Aspirin 81mg  daily and Ticagrelor 90mg  twice daily for a minimum of 12 months (ACS-Class I recommendation).   Continue Thienopyridine SAPT monotherapy for second year.  (Okay to interrupt at that time) High-dose statin-80 mg atorvastatin Consider reinitiating antihypertensives-beta-blocker/ARB once blood pressure stabilizes in the morning.    Bryan Lemma, MD  Findings Coronary Findings Diagnostic  Dominance: Right  Left Anterior Descending Prox LAD to Mid LAD lesion is 30% stenosed. The lesion is segmental and eccentric.  First Diagonal Branch Vessel is small in size.  Left Circumflex  First Obtuse Marginal Branch Vessel is small in size.  Second Obtuse Marginal Branch Vessel is moderate in size. 2nd Mrg lesion is 80% stenosed.  Right Coronary Artery Prox RCA-1 lesion is 60% stenosed. Prox RCA-2 lesion is 100% stenosed. Vessel is the culprit lesion. The lesion is located proximal to the major branch, segmental and thrombotic. Prox RCA to Mid RCA lesion is 90% stenosed. Mid RCA to Dist RCA lesion is 40% stenosed.  Right Ventricular Branch Vessel is small in size.  Right Posterior Atrioventricular Artery Vessel is small in size.  First Right Posterolateral Branch Vessel is small in size.  Second Right Posterolateral Branch Vessel is small in size.  Intervention  Prox RCA-1 lesion Stent (Also treats lesions: Prox RCA-2, and Prox RCA to Mid RCA) Lesion length:  29 mm. CATH VISTA GUIDE 6FR JR4 guide catheter was inserted. Lesion crossed with guidewire using a WIRE ASAHI PROWATER 180CM. Pre-stent angioplasty was performed using a BALLN SAPPHIRE 2.5X12. Maximum pressure:  10 atm. Inflation time:  20 sec. A drug-eluting stent was successfully placed using a STENT ONYX FRONTIER 2.75X30. Maximum pressure: 14 atm. Inflation time: 30 sec. Stent strut is  well apposed. Post-stent angioplasty was performed. Maximum pressure:  20 atm. Inflation time:  15 sec. Stent balloon to high ATM-deployed to 2.9 mm Stent covers all 3 lesions Post-Intervention Lesion Assessment The intervention was successful. Pre-interventional TIMI flow is 0. Post-intervention TIMI flow is 3. No complications occurred at this lesion. There is a 0% residual stenosis post intervention.  Prox RCA-2 lesion Stent (Also treats lesions: Prox RCA-1, and Prox RCA to Mid RCA) See details in Prox RCA-1 lesion. Post-Intervention Lesion Assessment The intervention was successful. Pre-interventional TIMI flow is 0. Post-intervention TIMI flow is 3. Treated lesion length:  30 mm. No complications occurred at this lesion. There is a 0% residual stenosis post intervention.  Prox RCA to Mid RCA lesion Stent (Also treats lesions: Prox RCA-1, and Prox RCA-2) See details in Prox RCA-1 lesion. Post-Intervention Lesion Assessment The intervention was successful. Pre-interventional TIMI flow is 0. Post-intervention TIMI flow is 3. Treated lesion length:  30 mm. No complications occurred at this lesion. There is a 0% residual stenosis post intervention.    ECHOCARDIOGRAM  ECHOCARDIOGRAM COMPLETE 11/26/2022  Narrative ECHOCARDIOGRAM REPORT    Patient Name:   Wayne Huff Date of Exam: 11/26/2022 Medical Rec #:  [id number]      Height:       67.0 in Accession #:    [id number]     Weight:       178.4 lb Date of Birth:  [birth year]       BSA:          1.926 m Patient Age:    52 years       BP:           123/82 mmHg Patient Gender: M              HR:           63 bpm. Exam Location:  Inpatient  Procedure: 2D Echo, Cardiac Doppler and Color Doppler  Indications:    CAD I25.10  History:        Patient has no prior history of Echocardiogram examinations. CAD and Previous Myocardial Infarction; Risk Factors:Former Smoker. 11/25/22-CORONARY STENT INTERVENTION.  Sonographer:    Dondra Prader  RVT RCS Referring Phys: 12 [facility]   Sonographer Comments: Technically challenging study due to limited acoustic windows. IMPRESSIONS  1. Left ventricular ejection fraction, by estimation, is 45 to 50%. The left ventricle has mildly decreased function. The left ventricle demonstrates regional wall motion abnormalities (see scoring diagram/findings for description). Left ventricular diastolic parameters are indeterminate. There is akinesis of the left ventricular, entire inferior wall. There is hypokinesis of the left ventricular, entire inferolateral wall. There is hypokinesis of the left ventricular, basal inferoseptal wall. 2. Right ventricular systolic function is normal. The right ventricular size is normal. Tricuspid regurgitation signal is inadequate for assessing PA pressure. 3. The mitral valve is normal in structure. Trivial mitral valve regurgitation. No evidence of mitral stenosis. 4. The aortic valve is tricuspid. Aortic valve regurgitation is not visualized. Aortic valve sclerosis/calcification is present, without any evidence of aortic stenosis. Aortic valve area, by VTI measures 2.46 cm. Aortic valve mean gradient measures 3.0 mmHg. Aortic valve Vmax measures 1.19 m/s. 5. The inferior vena cava is normal in size with greater than 50% respiratory variability, suggesting right atrial pressure of 3 mmHg. 6. Recommend repeat limited study with definity contrast as the inferolateral endocardium is not well visualized.  FINDINGS Left Ventricle: Left ventricular ejection fraction, by estimation, is 45 to 50%. The left ventricle has mildly decreased function. The left ventricle demonstrates regional wall motion abnormalities. The left ventricular internal cavity size was normal in size. There is no left ventricular hypertrophy. Left ventricular diastolic parameters are indeterminate. Normal left ventricular filling pressure.  Right Ventricle: The right ventricular size is  normal. No increase in right ventricular wall thickness. Right ventricular systolic function is normal. Tricuspid regurgitation signal is inadequate for assessing PA pressure.  Left Atrium: Left atrial size was normal in size.  Right Atrium: Right atrial size was normal in size.  Pericardium: Trivial pericardial effusion is present. The pericardial effusion is anterior to the right ventricle and localized near the right atrium.  Mitral Valve: The mitral valve is normal in structure. Trivial mitral valve regurgitation. No evidence of mitral valve stenosis.  Tricuspid Valve: The tricuspid valve is normal in structure. Tricuspid valve regurgitation is trivial. No evidence of tricuspid stenosis.  Aortic Valve: The aortic valve is tricuspid. Aortic valve regurgitation is not visualized. Aortic valve sclerosis/calcification is present, without any evidence of aortic stenosis. Aortic valve mean gradient measures 3.0 mmHg. Aortic valve peak gradient measures 5.7 mmHg. Aortic valve area, by VTI measures 2.46 cm.  Pulmonic Valve: The pulmonic valve was normal in structure. Pulmonic valve regurgitation is not visualized. No evidence of pulmonic stenosis.  Aorta: The aortic root is normal in size and structure.  Venous: The inferior vena cava is normal in size with greater than 50% respiratory variability, suggesting right atrial pressure of 3 mmHg.  IAS/Shunts: No atrial level shunt detected by color flow Doppler.   LEFT VENTRICLE PLAX 2D LVIDd:         4.50 cm   Diastology LVIDs:         3.40 cm   LV e' medial:    7.18 cm/s LV PW:         1.20 cm   LV E/e' medial:  11.5 LV IVS:        0.90 cm   LV e' lateral:   11.70 cm/s LVOT diam:     1.90 cm   LV E/e' lateral: 7.0 LV SV:         51 LV SV Index:   27 LVOT Area:     2.84 cm   RIGHT VENTRICLE  IVC RV S prime:     13.80 cm/s  IVC diam: 1.80 cm TAPSE (M-mode): 1.9 cm  LEFT ATRIUM             Index       RIGHT ATRIUM           Index LA diam:        2.60 cm 1.35 cm/m  RA Area:     7.93 cm LA Vol (A2C):   17.3 ml 8.98 ml/m  RA Volume:   18.00 ml 9.35 ml/m LA Vol (A4C):   14.1 ml 7.32 ml/m LA Biplane Vol: 16.1 ml 8.36 ml/m AORTIC VALVE                    PULMONIC VALVE AV Area (Vmax):    2.55 cm     PV Vmax:       0.95 m/s AV Area (Vmean):   2.28 cm     PV Peak grad:  3.6 mmHg AV Area (VTI):     2.46 cm AV Vmax:           119.00 cm/s AV Vmean:          77.000 cm/s AV VTI:            0.209 m AV Peak Grad:      5.7 mmHg AV Mean Grad:      3.0 mmHg LVOT Vmax:         107.00 cm/s LVOT Vmean:        61.800 cm/s LVOT VTI:          0.181 m LVOT/AV VTI ratio: 0.87  AORTA Ao Root diam: 3.20 cm Ao Asc diam:  3.20 cm  MITRAL VALVE MV Area (PHT): 3.68 cm    SHUNTS MV Decel Time: 206 msec    Systemic VTI:  0.18 m MV E velocity: 82.30 cm/s  Systemic Diam: 1.90 cm MV A velocity: 87.00 cm/s MV E/A ratio:  0.95  Armanda Magic MD Electronically signed by Armanda Magic MD Signature Date/Time: 11/26/2022/10:10:27 AM    Final                   Recent Labs: 11/24/2022: TSH 1.196 12/03/2022: Hemoglobin 14.2; Platelets 297 12/25/2022: ALT 32; BUN 10; Creatinine, Ser 1.21; Potassium 4.6; Sodium 140  Recent Lipid Panel    Component Value Date/Time   CHOL 111 12/25/2022 1257   TRIG 102 12/25/2022 1257   HDL 41 12/25/2022 1257   CHOLHDL 2.7 12/25/2022 1257   CHOLHDL 4.1 11/24/2022 0722   VLDL 15 11/24/2022 0722   LDLCALC 51 12/25/2022 1257     Risk Assessment/Calculations:                Physical Exam:    VS:  BP 108/66   Pulse (!) 59   Ht 5\' 7"  (1.702 m)   Wt 184 lb 9.6 oz (83.7 kg)   SpO2 96%   BMI 28.91 kg/m     Wt Readings from Last 3 Encounters:  01/23/23 184 lb 9.6 oz (83.7 kg)  01/08/23 186 lb (84.4 kg)  12/03/22 185 lb 12.8 oz (84.3 kg)     GEN:  Well nourished, well developed in no acute distress HEENT: Normal NECK: No JVD; No carotid bruits LYMPHATICS: No  lymphadenopathy CARDIAC: RRR, no murmurs, rubs, gallops RESPIRATORY:  Clear to auscultation without rales, wheezing or rhonchi  ABDOMEN: Soft, non-tender, non-distended MUSCULOSKELETAL:  No edema; No deformity  SKIN: Warm and  dry NEUROLOGIC:  Alert and oriented x 3 PSYCHIATRIC:  Normal affect   ASSESSMENT:    1. History of ST elevation myocardial infarction (STEMI)   2. Chronic systolic heart failure (HCC)   3. CAD S/P percutaneous coronary angioplasty   4. Primary hypertension   5. Hyperlipidemia with target LDL less than 70   6. Research study patient   7. Chronic bilateral low back pain with bilateral sciatica   8. Type 2 diabetes mellitus without complication, without long-term current use of insulin (HCC)    PLAN:    In order of problems listed above:   CAD s/p STEMI 12/03/22 - DES-RCA that is 30 mm long - DES to 2nd marginal that is 28 mm  - continue ASA and plavix - 80 mg lipitor   HFmrEF - LVEF 45-50% - GDMT: irbesartan, toprol, and spironolactone - repeat echo    Hyperlipidemia with LDL goal <  70 11/24/2022: VLDL 15 12/25/2022: Cholesterol, Total 111; HDL 41; LDL Chol Calc (NIH) 51; Triglycerides 102 - continue 80 mg lipitor - LP(a)  Also on librexia   Diabetes - A1c 6.5% - will add 10 mg farxiga   Back pain - he states he is due for back injections for lower back pain - he is cleared for injections but he may not interrupt DAPT for 12 months given STEMI presentation and length of stents   He asks about disability due to CAD I do not think he qualifies.   Follow up in 6 months     Medication Adjustments/Labs and Tests Ordered: Current medicines are reviewed at length with the patient today.  Concerns regarding medicines are outlined above.  No orders of the defined types were placed in this encounter.  No orders of the defined types were placed in this encounter.   There are no Patient Instructions on file for this visit.    Signed, Roe Rutherford Bryla Burek, PA  01/23/2023 2:08 PM    [facility]

## 2023-01-23 ENCOUNTER — Other Ambulatory Visit (HOSPITAL_COMMUNITY): Payer: Self-pay

## 2023-01-23 ENCOUNTER — Other Ambulatory Visit: Payer: Self-pay

## 2023-01-23 ENCOUNTER — Ambulatory Visit: Payer: Medicaid Other | Attending: Physician Assistant | Admitting: Physician Assistant

## 2023-01-23 ENCOUNTER — Encounter: Payer: Self-pay | Admitting: Physician Assistant

## 2023-01-23 VITALS — BP 108/66 | HR 59 | Ht 67.0 in | Wt 184.6 lb

## 2023-01-23 DIAGNOSIS — Z006 Encounter for examination for normal comparison and control in clinical research program: Secondary | ICD-10-CM

## 2023-01-23 DIAGNOSIS — I252 Old myocardial infarction: Secondary | ICD-10-CM

## 2023-01-23 DIAGNOSIS — I2510 Atherosclerotic heart disease of native coronary artery without angina pectoris: Secondary | ICD-10-CM

## 2023-01-23 DIAGNOSIS — I10 Essential (primary) hypertension: Secondary | ICD-10-CM | POA: Diagnosis not present

## 2023-01-23 DIAGNOSIS — M5442 Lumbago with sciatica, left side: Secondary | ICD-10-CM

## 2023-01-23 DIAGNOSIS — Z9861 Coronary angioplasty status: Secondary | ICD-10-CM

## 2023-01-23 DIAGNOSIS — E119 Type 2 diabetes mellitus without complications: Secondary | ICD-10-CM

## 2023-01-23 DIAGNOSIS — I5022 Chronic systolic (congestive) heart failure: Secondary | ICD-10-CM

## 2023-01-23 DIAGNOSIS — E785 Hyperlipidemia, unspecified: Secondary | ICD-10-CM

## 2023-01-23 DIAGNOSIS — G8929 Other chronic pain: Secondary | ICD-10-CM

## 2023-01-23 DIAGNOSIS — M5441 Lumbago with sciatica, right side: Secondary | ICD-10-CM

## 2023-01-23 MED ORDER — SPIRONOLACTONE 25 MG PO TABS
12.5000 mg | ORAL_TABLET | Freq: Every day | ORAL | 3 refills | Status: DC
  Filled 2023-01-23: qty 45, 90d supply, fill #0
  Filled 2023-05-26: qty 45, 90d supply, fill #1

## 2023-01-23 MED ORDER — NITROGLYCERIN 0.4 MG SL SUBL
0.4000 mg | SUBLINGUAL_TABLET | SUBLINGUAL | 2 refills | Status: AC | PRN
  Filled 2023-01-23: qty 25, 1d supply, fill #0

## 2023-01-23 MED ORDER — CLOPIDOGREL BISULFATE 75 MG PO TABS
75.0000 mg | ORAL_TABLET | Freq: Every day | ORAL | 3 refills | Status: DC
  Filled 2023-01-23 (×2): qty 90, 90d supply, fill #0
  Filled 2023-04-23: qty 90, 90d supply, fill #1
  Filled 2023-07-23: qty 90, 90d supply, fill #2
  Filled 2023-10-20: qty 90, 90d supply, fill #3

## 2023-01-23 MED ORDER — IRBESARTAN 75 MG PO TABS
37.5000 mg | ORAL_TABLET | Freq: Every day | ORAL | 0 refills | Status: DC
  Filled 2023-01-23: qty 45, 90d supply, fill #0
  Filled 2023-04-23: qty 45, 90d supply, fill #1

## 2023-01-23 MED ORDER — DAPAGLIFLOZIN PROPANEDIOL 10 MG PO TABS
10.0000 mg | ORAL_TABLET | Freq: Every day | ORAL | 3 refills | Status: DC
  Filled 2023-01-23 – 2023-11-14 (×3): qty 90, 90d supply, fill #0

## 2023-01-23 MED ORDER — METOPROLOL SUCCINATE ER 25 MG PO TB24
25.0000 mg | ORAL_TABLET | Freq: Every day | ORAL | 3 refills | Status: DC
  Filled 2023-01-23: qty 30, 30d supply, fill #0
  Filled 2023-02-25: qty 30, 30d supply, fill #1
  Filled 2023-04-01: qty 90, 90d supply, fill #2
  Filled 2023-07-03: qty 90, 90d supply, fill #3

## 2023-01-23 MED ORDER — ATORVASTATIN CALCIUM 80 MG PO TABS
80.0000 mg | ORAL_TABLET | Freq: Every day | ORAL | 3 refills | Status: AC
  Filled 2023-01-23 – 2023-02-25 (×2): qty 90, 90d supply, fill #0
  Filled 2023-05-26: qty 90, 90d supply, fill #1
  Filled 2023-08-22: qty 90, 90d supply, fill #2
  Filled 2023-11-21: qty 90, 90d supply, fill #3

## 2023-01-23 MED ORDER — METOPROLOL SUCCINATE ER 25 MG PO TB24
25.0000 mg | ORAL_TABLET | Freq: Every day | ORAL | 1 refills | Status: DC
  Filled 2023-01-23: qty 90, 90d supply, fill #0

## 2023-01-23 NOTE — Patient Instructions (Signed)
Medication Instructions:  Start OTC Omeprazole 20 mg daily Stop chewable Asprin Start Asprin EC daily Start Farxiga 10 mg daily  *If you need a refill on your cardiac medications before your next appointment, please call your pharmacy*   Lab Work: NONE ordered at this time of appointment   Testing/Procedures: Your physician has requested that you have an echocardiogram. Echocardiography is a painless test that uses sound waves to create images of your heart. It provides your doctor with information about the size and shape of your heart and how well your heart's chambers and valves are working. This procedure takes approximately one hour. There are no restrictions for this procedure. Please do NOT wear cologne, perfume, aftershave, or lotions (deodorant is allowed). Please arrive 15 minutes prior to your appointment time.  Please note: We ask at that you not bring children with you during ultrasound (echo/ vascular) testing. Due to room size and safety concerns, children are not allowed in the ultrasound rooms during exams. Our front office staff cannot provide observation of children in our lobby area while testing is being conducted. An adult accompanying a patient to their appointment will only be allowed in the ultrasound room at the discretion of the ultrasound technician under special circumstances. We apologize for any inconvenience.    Follow-Up: At [facility], you and your health needs are our priority.  As part of our continuing mission to provide you with exceptional heart care, we have created designated Provider Care Teams.  These Care Teams include your primary Cardiologist (physician) and Advanced Practice Providers (APPs -  Physician Assistants and Nurse Practitioners) who all work together to provide you with the care you need, when you need it.  We recommend signing up for the patient portal called "MyChart".  Sign up information is provided on this After Visit  Summary.  MyChart is used to connect with patients for Virtual Visits (Telemedicine).  Patients are able to view lab/test results, encounter notes, upcoming appointments, etc.  Non-urgent messages can be sent to your provider as well.   To learn more about what you can do with MyChart, go to [email or web address].    Your next appointment:   6 month(s)  Provider:   Peter Swaziland, MD  or Micah Flesher, PA-C

## 2023-01-23 NOTE — Addendum Note (Signed)
Addended by: Lamar Benes on: 01/23/2023 02:17 PM   Modules accepted: Orders

## 2023-01-23 NOTE — Research (Signed)
Called pt to ask about signs and symptoms of bleeding/hematochezia. Pt denies any s/s of bleeding and has not had any since the initial event and feels well. Will cancel f/u appt with Riley Kill, MD and he will see pt at regular scheduled appt for Librexia on 02/24/23.

## 2023-01-23 NOTE — Addendum Note (Signed)
Addended by: Lamar Benes on: 01/23/2023 02:24 PM   Modules accepted: Orders

## 2023-01-24 ENCOUNTER — Other Ambulatory Visit (HOSPITAL_COMMUNITY): Payer: Self-pay

## 2023-01-25 ENCOUNTER — Ambulatory Visit (HOSPITAL_COMMUNITY)
Admission: EM | Admit: 2023-01-25 | Discharge: 2023-01-25 | Disposition: A | Discharge status: Home / Self Care | Payer: Medicaid Other | Attending: Emergency Medicine | Admitting: Emergency Medicine

## 2023-01-25 ENCOUNTER — Encounter (HOSPITAL_COMMUNITY): Payer: Self-pay

## 2023-01-25 DIAGNOSIS — J00 Acute nasopharyngitis [common cold]: Secondary | ICD-10-CM | POA: Diagnosis not present

## 2023-01-25 LAB — POC COVID19/FLU A&B COMBO
Covid Antigen, POC: NEGATIVE
Influenza A Antigen, POC: NEGATIVE
Influenza B Antigen, POC: NEGATIVE

## 2023-01-25 MED ORDER — IPRATROPIUM BROMIDE 0.03 % NA SOLN: 1.0000 | Freq: Two times a day (BID) | NASAL | 12 refills | Status: DC | PRN

## 2023-01-25 NOTE — ED Triage Notes (Signed)
Patient here today with c/o nasal congestion, headache, sneezing, and fatigue since yesterday.

## 2023-01-25 NOTE — Discharge Instructions (Addendum)
You were negative for flu and COVID.  Antibiotic with not treat a virus.   You are at high risk for complications.  So if you start feeling worse please follow up with someone.   Atrovent for congestion Mucinex 600mg   twice daily if needed for cough - OTC medication Salt water gargles for sore throat and mucus Tylenol 1000mg  three times a day for generalized body aches and fever Ensure hydration Consider adding on Zinc and Vit C for immunity  Follow up with primary care or urgent care if symptoms continue greater than one week

## 2023-01-29 ENCOUNTER — Ambulatory Visit: Payer: Medicaid Other | Admitting: Physical Medicine and Rehabilitation

## 2023-01-29 ENCOUNTER — Other Ambulatory Visit: Payer: Self-pay

## 2023-01-29 DIAGNOSIS — M5416 Radiculopathy, lumbar region: Secondary | ICD-10-CM | POA: Diagnosis not present

## 2023-01-29 MED ORDER — METHYLPREDNISOLONE ACETATE 40 MG/ML IJ SUSP
40.0000 mg | Freq: Once | INTRAMUSCULAR | Status: AC
  Administered 2023-01-29: 40 mg

## 2023-01-29 NOTE — Patient Instructions (Signed)

## 2023-01-29 NOTE — Progress Notes (Signed)
Functional Pain Scale - descriptive words and definitions  Mild (2)   Noticeable when not distracted/no impact on ADL's/sleep only slightly affected and able to   use both passive and active distraction for comfort. Mild range order  Average Pain 3  Pain is more when he is walking / moving around doing stuff.  +Driver, -BT, -Dye Allergies.

## 2023-02-06 NOTE — Progress Notes (Signed)
Wayne Huff - 53 y.o. male MRN [id number]  Date of birth: 1970-04-25  Office Visit Note: Visit Date: 01/29/2023 PCP: Patient, No Pcp Per Referred by: Juanda Chance, NP  Subjective: Chief Complaint  Patient presents with   Lower Back - Pain   HPI:  Wayne Huff is a 53 y.o. male who comes in today at the request of Ellin Goodie, FNP for planned Bilateral L5-S1 Lumbar Transforaminal epidural steroid injection with fluoroscopic guidance.  The patient has failed conservative care including home exercise, medications, time and activity modification.  This injection will be diagnostic and hopefully therapeutic.  Please see requesting physician notes for further details and justification.   ROS Otherwise per HPI.  Assessment & Plan: Visit Diagnoses:    ICD-10-CM   1. Lumbar radiculopathy  M54.16 XR C-ARM NO REPORT    Epidural Steroid injection    methylPREDNISolone acetate (DEPO-MEDROL) injection 40 mg      Plan: No additional findings.   Meds & Orders:  Meds ordered this encounter  Medications   methylPREDNISolone acetate (DEPO-MEDROL) injection 40 mg    Orders Placed This Encounter  Procedures   XR C-ARM NO REPORT   Epidural Steroid injection    Follow-up: Return for visit to requesting provider as needed.   Procedures: No procedures performed  Lumbosacral Transforaminal Epidural Steroid Injection - Sub-Pedicular Approach with Fluoroscopic Guidance  [name]      Date of Birth: 03-31-1970 MRN: [id number] PCP: Patient, No Pcp Per      Visit Date: 01/29/2023   Universal Protocol:    Date/Time: 01/29/2023  Consent Given By: the patient  Position: PRONE  Additional Comments: Vital signs were monitored before and after the procedure. Patient was prepped and draped in the usual sterile fashion. The correct patient, procedure, and site was verified.   Injection Procedure Details:   Procedure diagnoses: Lumbar radiculopathy [M54.16]    Meds  Administered:  Meds ordered this encounter  Medications   methylPREDNISolone acetate (DEPO-MEDROL) injection 40 mg    Laterality: Bilateral  Location/Site: L5  Needle:5.0 in., 22 ga.  Short bevel or Quincke spinal needle  Needle Placement: Transforaminal  Findings:    -Comments: Excellent flow of contrast along the nerve, nerve root and into the epidural space.  Procedure Details: After squaring off the end-plates to get a true AP view, the C-arm was positioned so that an oblique view of the foramen as noted above was visualized. The target area is just inferior to the "nose of the scotty dog" or sub pedicular. The soft tissues overlying this structure were infiltrated with 2-3 ml. of 1% Lidocaine without Epinephrine.  The spinal needle was inserted toward the target using a "trajectory" view along the fluoroscope beam.  Under AP and lateral visualization, the needle was advanced so it did not puncture dura and was located close the 6 O'Clock position of the pedical in AP tracterory. Biplanar projections were used to confirm position. Aspiration was confirmed to be negative for CSF and/or blood. A 1-2 ml. volume of Isovue-250 was injected and flow of contrast was noted at each level. Radiographs were obtained for documentation purposes.   After attaining the desired flow of contrast documented above, a 0.5 to 1.0 ml test dose of 0.25% Marcaine was injected into each respective transforaminal space.  The patient was observed for 90 seconds post injection.  After no sensory deficits were reported, and normal lower extremity motor function was noted,   the above injectate  was administered so that equal amounts of the injectate were placed at each foramen (level) into the transforaminal epidural space.   Additional Comments:  The patient tolerated the procedure well Dressing: 2 x 2 sterile gauze and Band-Aid    Post-procedure details: Patient was observed during the  procedure. Post-procedure instructions were reviewed.  Patient left the clinic in stable condition.    Clinical History: MRI LUMBAR SPINE WITHOUT CONTRAST     TECHNIQUE:  Multiplanar, multisequence MR imaging of the lumbar spine was  performed. No intravenous contrast was administered.     COMPARISON:  Lumbar spine radiographs 10/11/2019     FINDINGS:  Segmentation:  Standard.     Alignment:  Normal.     Vertebrae: No acute fracture, suspicious osseous lesion, or  significant marrow edema. Possible left-sided L5 pars defect without  significant listhesis.     Conus medullaris and cauda equina: Conus extends to the T12-L1  level. Conus and cauda equina appear normal.     Paraspinal and other soft tissues: Unremarkable.     Disc levels:     T12-L1 and L1-2: Negative.     L2-3: Minimal disc bulging and mild facet and ligamentum flavum  hypertrophy without stenosis.     L3-4: Minimal disc bulging and mild facet and ligamentum flavum  hypertrophy without stenosis.     L4-5: Disc desiccation and slight disc space narrowing. Mild disc  bulging, a small right foraminal disc protrusion, moderate  ligamentum flavum hypertrophy, and moderate to severe right and mild  left facet hypertrophy result in mild spinal stenosis and mild right  neural foraminal stenosis.     L5-S1: Minimal disc bulging and mild facet hypertrophy without  stenosis.     IMPRESSION:  Mild lumbar disc degeneration, greatest at L4-5 where advanced  posterior element hypertrophy contributes to mild spinal and right  neural foraminal stenosis.        Electronically Signed    By: Sebastian Ache M.D.    On: 11/26/2019 09:58     Objective:  VS:  HT:    WT:   BMI:     BP:   HR: bpm  TEMP: ( )  RESP:  Physical Exam Vitals and nursing note reviewed.  Constitutional:      General: He is not in acute distress.    Appearance: Normal appearance. He is not ill-appearing.  HENT:     Head:  Normocephalic and atraumatic.     Right Ear: External ear normal.     Left Ear: External ear normal.     Nose: No congestion.  Eyes:     Extraocular Movements: Extraocular movements intact.  Cardiovascular:     Rate and Rhythm: Normal rate.     Pulses: Normal pulses.  Pulmonary:     Effort: Pulmonary effort is normal. No respiratory distress.  Abdominal:     General: There is no distension.     Palpations: Abdomen is soft.  Musculoskeletal:        General: No tenderness or signs of injury.     Cervical back: Neck supple.     Right lower leg: No edema.     Left lower leg: No edema.     Comments: Patient has good distal strength without clonus.  Skin:    Findings: No erythema or rash.  Neurological:     General: No focal deficit present.     Mental Status: He is alert and oriented to person, place, and time.  Sensory: No sensory deficit.     Motor: No weakness or abnormal muscle tone.     Coordination: Coordination normal.  Psychiatric:        Mood and Affect: Mood normal.        Behavior: Behavior normal.      Imaging: No results found.

## 2023-02-06 NOTE — Procedures (Signed)
Lumbosacral Transforaminal Epidural Steroid Injection - Sub-Pedicular Approach with Fluoroscopic Guidance  [name]      Date of Birth: 02-16-1970 MRN: [id number] PCP: Patient, No Pcp Per      Visit Date: 01/29/2023   Universal Protocol:    Date/Time: 01/29/2023  Consent Given By: the patient  Position: PRONE  Additional Comments: Vital signs were monitored before and after the procedure. Patient was prepped and draped in the usual sterile fashion. The correct patient, procedure, and site was verified.   Injection Procedure Details:   Procedure diagnoses: Lumbar radiculopathy [M54.16]    Meds Administered:  Meds ordered this encounter  Medications   methylPREDNISolone acetate (DEPO-MEDROL) injection 40 mg    Laterality: Bilateral  Location/Site: L5  Needle:5.0 in., 22 ga.  Short bevel or Quincke spinal needle  Needle Placement: Transforaminal  Findings:    -Comments: Excellent flow of contrast along the nerve, nerve root and into the epidural space.  Procedure Details: After squaring off the end-plates to get a true AP view, the C-arm was positioned so that an oblique view of the foramen as noted above was visualized. The target area is just inferior to the "nose of the scotty dog" or sub pedicular. The soft tissues overlying this structure were infiltrated with 2-3 ml. of 1% Lidocaine without Epinephrine.  The spinal needle was inserted toward the target using a "trajectory" view along the fluoroscope beam.  Under AP and lateral visualization, the needle was advanced so it did not puncture dura and was located close the 6 O'Clock position of the pedical in AP tracterory. Biplanar projections were used to confirm position. Aspiration was confirmed to be negative for CSF and/or blood. A 1-2 ml. volume of Isovue-250 was injected and flow of contrast was noted at each level. Radiographs were obtained for documentation purposes.   After attaining the desired  flow of contrast documented above, a 0.5 to 1.0 ml test dose of 0.25% Marcaine was injected into each respective transforaminal space.  The patient was observed for 90 seconds post injection.  After no sensory deficits were reported, and normal lower extremity motor function was noted,   the above injectate was administered so that equal amounts of the injectate were placed at each foramen (level) into the transforaminal epidural space.   Additional Comments:  The patient tolerated the procedure well Dressing: 2 x 2 sterile gauze and Band-Aid    Post-procedure details: Patient was observed during the procedure. Post-procedure instructions were reviewed.  Patient left the clinic in stable condition.

## 2023-02-23 ENCOUNTER — Encounter (HOSPITAL_COMMUNITY): Payer: Self-pay

## 2023-02-23 ENCOUNTER — Ambulatory Visit (HOSPITAL_COMMUNITY)
Admission: EM | Admit: 2023-02-23 | Discharge: 2023-02-23 | Disposition: A | Discharge status: Home / Self Care | Payer: Medicaid Other | Attending: Emergency Medicine | Admitting: Emergency Medicine

## 2023-02-23 DIAGNOSIS — B9789 Other viral agents as the cause of diseases classified elsewhere: Secondary | ICD-10-CM

## 2023-02-23 DIAGNOSIS — J988 Other specified respiratory disorders: Secondary | ICD-10-CM

## 2023-02-23 LAB — POC COVID19/FLU A&B COMBO
Covid Antigen, POC: NEGATIVE
Influenza A Antigen, POC: NEGATIVE
Influenza B Antigen, POC: NEGATIVE

## 2023-02-23 NOTE — Discharge Instructions (Addendum)
 You tested negative for flu and COVID today. I believe your symptoms are from a viral illness. You can alternate between Tylenol and Ibuprofen as needed for pain and fever. I recommend Mucinex for cough and congestion as needed. Stay hydrated and get plenty of rest. Return here if symptoms persist or worsen.

## 2023-02-23 NOTE — ED Triage Notes (Signed)
 Chief Complaint: headache, body aches, runny nose, and sneezing. Denies cough and fever.   Sick exposure: Yes- has been around people that are sick, unsure what they had   Onset: 2 days   Prescriptions or OTC medications tried: No    New foods, medications, or products: No  Recent Travel: No

## 2023-02-23 NOTE — ED Provider Notes (Signed)
 [facility]    CSN: [id number] Arrival date & time: 02/23/23  1254      History   Chief Complaint Chief Complaint  Patient presents with   Generalized Body Aches   Nasal Congestion    HPI Wayne Huff is a 53 y.o. male.   Patient presents with headache, body aches, runny nose, congestion, sneezing, and diarrhea that began yesterday morning.  Denies cough, chest pain, shortness of breath, fatigue, nausea, vomiting, and abdominal pain.     Past Medical History:  Diagnosis Date   Arthritis    Back pain     Patient Active Problem List   Diagnosis Date Noted   Hyperlipidemia 11/26/2022   Acute heart failure with mildly reduced ejection fraction (HFmrEF, 41-49%) (HCC) 11/26/2022   Acute ST elevation myocardial infarction (STEMI) of inferior wall (HCC) 11/23/2022   DENTAL PAIN 05/29/2006    Past Surgical History:  Procedure Laterality Date   CORONARY STENT INTERVENTION N/A 11/25/2022   Procedure: CORONARY STENT INTERVENTION;  Surgeon: Marykay Lex, MD;  Location: [facility] INVASIVE CV LAB;  Service: Cardiovascular;  Laterality: N/A;   CORONARY/GRAFT ACUTE MI REVASCULARIZATION N/A 11/23/2022   Procedure: Coronary/Graft Acute MI Revascularization;  Surgeon: Marykay Lex, MD;  Location: [facility] INVASIVE CV LAB;  Service: Cardiovascular;  Laterality: N/A;   ELBOW SURGERY     LEFT HEART CATH AND CORONARY ANGIOGRAPHY N/A 11/23/2022   Procedure: LEFT HEART CATH AND CORONARY ANGIOGRAPHY;  Surgeon: Marykay Lex, MD;  Location: [facility] INVASIVE CV LAB;  Service: Cardiovascular;  Laterality: N/A;       Home Medications    Prior to Admission medications   Medication Sig Start Date End Date Taking? Authorizing Provider  aspirin EC 81 MG tablet Take 81 mg by mouth daily. Swallow whole.   Yes [provider]  atorvastatin (LIPITOR) 80 MG tablet Take 1 tablet (80 mg total) by mouth daily. 01/23/23  Yes Duke, Roe Rutherford, PA  clopidogrel (PLAVIX) 75 MG tablet Take  1 tablet (75 mg total) by mouth daily. 01/23/23  Yes Duke, Roe Rutherford, PA  dapagliflozin propanediol (FARXIGA) 10 MG TABS tablet Take 1 tablet (10 mg total) by mouth daily. 01/23/23  Yes Duke, Roe Rutherford, PA  irbesartan (AVAPRO) 75 MG tablet Take 1/2 tablet (37.5 mg total) by mouth daily. 01/23/23  Yes Duke, Roe Rutherford, PA  metoprolol succinate (TOPROL XL) 25 MG 24 hr tablet Take 1 tablet (25 mg total) by mouth daily. 01/23/23  Yes Duke, Roe Rutherford, PA  spironolactone (ALDACTONE) 25 MG tablet Take 1/2 tablet (12.5 mg total) by mouth daily. 01/23/23 04/24/23 Yes Duke, Roe Rutherford, PA  Study - LIBREXIA-ACS - milvexian 25 mg or placebo tablet (PI-Stuckey) Take 1 tablet by mouth 2 (two) times daily. For investigational use. Take with or without food two times daily. Bring bottle back to every visit. 12/25/22  Yes Herby Abraham, MD  ipratropium (ATROVENT) 0.03 % nasal spray Place 1 spray into both nostrils 2 (two) times daily as needed for rhinitis. 01/25/23   Blitch, Linde Gillis, NP  nitroGLYCERIN (NITROSTAT) 0.4 MG SL tablet Place 1 tablet (0.4 mg total) under the tongue every 5 (five) minutes as needed. 01/23/23   Duke, Roe Rutherford, PA  tiZANidine (ZANAFLEX) 4 MG tablet Take 1 tablet (4 mg total) by mouth every 6 (six) hours as needed for muscle spasms. 01/08/23   Marisa Cyphers, MD    Family History History reviewed. No pertinent family history.  Social History Social  History   Tobacco Use   Smoking status: Former    Current packs/day: 0.00    Types: Cigarettes    Quit date: 04/2022    Years since quitting: 0.8   Smokeless tobacco: Never  Vaping Use   Vaping status: Every Day  Substance Use Topics   Alcohol use: No   Drug use: No     Allergies   Patient has no known allergies.   Review of Systems Review of Systems  Per HPI  Physical Exam Triage Vital Signs ED Triage Vitals  Encounter Vitals Group     BP 02/23/23 1444 138/84     Systolic BP Percentile --       Diastolic BP Percentile --      Pulse Rate 02/23/23 1444 (!) 56     Resp 02/23/23 1444 18     Temp 02/23/23 1444 97.6 F (36.4 C)     Temp Source 02/23/23 1444 Oral     SpO2 02/23/23 1444 96 %     Weight --      Height --      Head Circumference --      Peak Flow --      Pain Score 02/23/23 1442 5     Pain Loc --      Pain Education --      Exclude from Growth Chart --    No data found.  Updated Vital Signs BP 138/84 (BP Location: Right Wrist)   Pulse (!) 56   Temp 97.6 F (36.4 C) (Oral)   Resp 18   SpO2 96%   Visual Acuity Right Eye Distance:   Left Eye Distance:   Bilateral Distance:    Right Eye Near:   Left Eye Near:    Bilateral Near:     Physical Exam Vitals and nursing note reviewed.  Constitutional:      General: He is awake. He is not in acute distress.    Appearance: Normal appearance. He is well-developed and well-groomed. He is not ill-appearing.  HENT:     Right Ear: Tympanic membrane, ear canal and external ear normal.     Left Ear: Tympanic membrane, ear canal and external ear normal.     Nose: Congestion and rhinorrhea present.     Mouth/Throat:     Mouth: Mucous membranes are moist.     Pharynx: Posterior oropharyngeal erythema present. No oropharyngeal exudate.  Cardiovascular:     Rate and Rhythm: Normal rate and regular rhythm.  Pulmonary:     Effort: Pulmonary effort is normal.     Breath sounds: Normal breath sounds.  Musculoskeletal:        General: Normal range of motion.  Skin:    General: Skin is warm and dry.  Neurological:     Mental Status: He is alert.  Psychiatric:        Behavior: Behavior is cooperative.      UC Treatments / Results  Labs (all labs ordered are listed, but only abnormal results are displayed) Labs Reviewed  POC COVID19/FLU A&B COMBO    EKG   Radiology No results found.  Procedures Procedures (including critical care time)  Medications Ordered in UC Medications - No data to  display  Initial Impression / Assessment and Plan / UC Course  I have reviewed the triage vital signs and the nursing notes.  Pertinent labs & imaging results that were available during my care of the patient were reviewed by me and considered in my medical  decision making (see chart for details).     Patient presented with headache, body aches, runny nose, congestion, sneezing, and diarrhea that began yesterday morning.  Denies any other symptoms.  Upon assessment congestion and rhinorrhea are present, mild erythema noted to pharynx.  Lungs clear bilaterally on auscultation.  Nontender upon palpation to abdomen.  Flu and COVID testing negative.  Discussed over-the-counter medications for viral illness related symptoms.  Discussed return precautions. Final Clinical Impressions(s) / UC Diagnoses   Final diagnoses:  Viral respiratory illness     Discharge Instructions      You tested negative for flu and COVID today. I believe your symptoms are from a viral illness. You can alternate between Tylenol and Ibuprofen as needed for pain and fever. I recommend Mucinex for cough and congestion as needed. Stay hydrated and get plenty of rest. Return here if symptoms persist or worsen.       ED Prescriptions   None    PDMP not reviewed this encounter.   Wynonia Lawman A, NP 02/23/23 [phone]

## 2023-02-24 VITALS — BP 148/76 | HR 62

## 2023-02-24 DIAGNOSIS — Z006 Encounter for examination for normal comparison and control in clinical research program: Secondary | ICD-10-CM

## 2023-02-24 MED ORDER — STUDY - LIBREXIA-ACS - MILVEXIAN 25 MG OR PLACEBO TABLET (PI-STUCKEY): 1.0000 | ORAL_TABLET | Freq: Two times a day (BID) | ORAL | 0 refills | Status: DC

## 2023-02-24 NOTE — Research (Addendum)
 LIBREXIA ACS 13 WEEK VISIT    [facility] Hematology/Chemistry drawn: [x] Yes [] No   Medication Kit Accountability Dispensed Status [x] Dispensed [] Dispensed In Error [] Not Dispensed If 'Not Dispensed', provide reason: [] Medical Decision [] Medication Kit not available or damaged [] Study medication discontinued [] Visit skipped or administration skipped [] Medication kit dispensed in error Date Dispensed: 17/Feb/2025  Medication Number Dispensed:  Medication Number: [id number] Date: 97827974  Medication Number: [id number] Date: 97827974  Medication Number: [id number] Date: 97827974  Medication Number: [phone] Date: [id number]  Return Status: [] Damaged/Returned by subject [] Missing/Not returned by subject [x] Returned by subject If not returned reasons: [] Forgot [] Lost  [phone] pills returned  Subject forgot 2 other bottles. Will return to me later this week.   [phone] with 70 tablets returned on 18/Feb/2025.  [phone] with 0 tablets returned on 18/Feb/2025.   Compliance%: 92  Were any suspected endpoint events or adverse events experienced? [] Yes [x] No     Current Outpatient Medications:    aspirin  EC 81 MG tablet, Take 81 mg by mouth daily. Swallow whole., Disp: , Rfl:    atorvastatin  (LIPITOR) 80 MG tablet, Take 1 tablet (80 mg total) by mouth daily., Disp: 90 tablet, Rfl: 3   clopidogrel  (PLAVIX ) 75 MG tablet, Take 1 tablet (75 mg total) by mouth daily., Disp: 90 tablet, Rfl: 3   dapagliflozin  propanediol (FARXIGA ) 10 MG TABS tablet, Take 1 tablet (10 mg total) by mouth daily., Disp: 90 tablet, Rfl: 3   ipratropium (ATROVENT ) 0.03 % nasal spray, Place 1 spray into both nostrils 2 (two) times daily as needed for rhinitis., Disp: 30 mL, Rfl: 12   irbesartan  (AVAPRO ) 75 MG tablet, Take 1/2 tablet (37.5 mg total) by mouth daily., Disp: 90 tablet, Rfl: 0   metoprolol  succinate (TOPROL  XL) 25 MG 24 hr tablet, Take 1 tablet (25 mg total) by mouth daily., Disp: 90 tablet, Rfl: 3    nitroGLYCERIN  (NITROSTAT ) 0.4 MG SL tablet, Place 1 tablet (0.4 mg total) under the tongue every 5 (five) minutes as needed., Disp: 25 tablet, Rfl: 2   spironolactone  (ALDACTONE ) 25 MG tablet, Take 1/2 tablet (12.5 mg total) by mouth daily., Disp: 45 tablet, Rfl: 3   Study - LIBREXIA-ACS - milvexian 25 mg or placebo tablet (PI-Stuckey), Take 1 tablet by mouth 2 (two) times daily. For investigational use. Take with or without food two times daily. Bring bottle back to every visit., Disp: 280 tablet, Rfl: 0   tiZANidine  (ZANAFLEX ) 4 MG tablet, Take 1 tablet (4 mg total) by mouth every 6 (six) hours as needed for muscle spasms. (Patient not taking: Reported on 02/24/2023), Disp: 30 tablet, Rfl: 0

## 2023-02-25 ENCOUNTER — Other Ambulatory Visit (HOSPITAL_COMMUNITY): Payer: Self-pay

## 2023-02-27 ENCOUNTER — Ambulatory Visit (HOSPITAL_COMMUNITY)
Admission: RE | Admit: 2023-02-27 | Discharge: 2023-02-27 | Disposition: A | Discharge status: Home / Self Care | Payer: Medicaid Other | Source: Ambulatory Visit | Attending: Internal Medicine | Admitting: Internal Medicine

## 2023-02-27 DIAGNOSIS — I5022 Chronic systolic (congestive) heart failure: Secondary | ICD-10-CM | POA: Diagnosis present

## 2023-02-27 LAB — ECHOCARDIOGRAM COMPLETE
AR max vel: 2.71 cm2
AV Area VTI: 2.55 cm2
AV Area mean vel: 2.39 cm2
AV Mean grad: 4 mm[Hg]
AV Peak grad: 9.9 mm[Hg]
Ao pk vel: 1.57 m/s
Area-P 1/2: 3.66 cm2
S' Lateral: 2.84 cm

## 2023-02-28 NOTE — Research (Addendum)
Are there any labs that are clinically significant?  Yes [] OR No[x]  Is the patient eligible to continue enrollment in the study after screening visit?  Yes [x]  OR No[]          

## 2023-03-03 ENCOUNTER — Telehealth: Payer: Self-pay

## 2023-03-03 NOTE — Telephone Encounter (Signed)
 Spoke with pt. Pt was notified of echo results.

## 2023-04-01 ENCOUNTER — Other Ambulatory Visit (HOSPITAL_COMMUNITY): Payer: Self-pay

## 2023-04-23 ENCOUNTER — Other Ambulatory Visit (HOSPITAL_COMMUNITY): Payer: Self-pay

## 2023-05-15 ENCOUNTER — Ambulatory Visit (HOSPITAL_COMMUNITY)
Admission: EM | Admit: 2023-05-15 | Discharge: 2023-05-15 | Disposition: A | Discharge status: Home / Self Care | Attending: Family Medicine | Admitting: Family Medicine

## 2023-05-15 ENCOUNTER — Encounter (HOSPITAL_COMMUNITY): Payer: Self-pay

## 2023-05-15 ENCOUNTER — Other Ambulatory Visit (HOSPITAL_COMMUNITY): Payer: Self-pay

## 2023-05-15 DIAGNOSIS — J069 Acute upper respiratory infection, unspecified: Secondary | ICD-10-CM

## 2023-05-15 HISTORY — DX: Essential (primary) hypertension: I10

## 2023-05-15 HISTORY — DX: Acute myocardial infarction, unspecified: I21.9

## 2023-05-15 LAB — POC COVID19/FLU A&B COMBO
Covid Antigen, POC: NEGATIVE
Influenza A Antigen, POC: NEGATIVE
Influenza B Antigen, POC: NEGATIVE

## 2023-05-15 MED ORDER — BENZONATATE 200 MG PO CAPS
200.0000 mg | ORAL_CAPSULE | Freq: Three times a day (TID) | ORAL | 0 refills | Status: DC | PRN
  Filled 2023-05-15: qty 21, 7d supply, fill #0

## 2023-05-15 MED ORDER — ALBUTEROL SULFATE HFA 108 (90 BASE) MCG/ACT IN AERS
2.0000 | INHALATION_SPRAY | RESPIRATORY_TRACT | 0 refills | Status: DC | PRN
  Filled 2023-05-15: qty 18, 20d supply, fill #0

## 2023-05-15 NOTE — Discharge Instructions (Signed)
 You were seen today for upper respiratory symptoms.  Your flu/covid swab were negative today.  Your symptoms are likely due to another virus.  I have sent out a medication for cough, as well as an inhaler to use for any shortness of breath you  may feel.  Please use tylenol /motrin  for pain.  Please get plenty of rest and fluids.  Follow up if not improving as expected.

## 2023-05-15 NOTE — ED Triage Notes (Signed)
 Patient reports that he has had a productive cough with white sputum , headache, and nasal congestion x 4 days.   Patient states he has been taking Nyquil and [facility] cough medicine.

## 2023-05-15 NOTE — ED Provider Notes (Signed)
 [facility]    CSN: [id number] Arrival date & time: 05/15/23  7829      History   Chief Complaint Chief Complaint  Patient presents with   Cough   Nasal Congestion    HPI Wayne Huff is a 53 y.o. male.    Cough Associated symptoms: sore throat    Patient is here for 4 days of not feeling well.  Started with body aches, then with cough, congestion, headache and sore throat.  No n/v.  He was sob for a time, but that has improved.  He was around someone sick, but not sure with what.  Used otc medication for cough without help.       Past Medical History:  Diagnosis Date   Arthritis    Back pain    Heart attack [facility])    Hypertension     Patient Active Problem List   Diagnosis Date Noted   Hyperlipidemia 11/26/2022   Acute heart failure with mildly reduced ejection fraction (HFmrEF, 41-49%) (HCC) 11/26/2022   Acute ST elevation myocardial infarction (STEMI) of inferior wall (HCC) 11/23/2022   DENTAL PAIN 05/29/2006    Past Surgical History:  Procedure Laterality Date   CORONARY STENT INTERVENTION N/A 11/25/2022   Procedure: CORONARY STENT INTERVENTION;  Surgeon: Arleen Lacer, MD;  [facility];  Service: Cardiovascular;  Laterality: N/A;   CORONARY/GRAFT ACUTE MI REVASCULARIZATION N/A 11/23/2022   Procedure: Coronary/Graft Acute MI Revascularization;  Surgeon: Arleen Lacer, MD;  Location: [facility] INVASIVE CV LAB;  Service: Cardiovascular;  Laterality: N/A;   ELBOW SURGERY     LEFT HEART CATH AND CORONARY ANGIOGRAPHY N/A 11/23/2022   Procedure: LEFT HEART CATH AND CORONARY ANGIOGRAPHY;  Surgeon: Arleen Lacer, MD;  Location: [facility] INVASIVE CV LAB;  Service: Cardiovascular;  Laterality: N/A;       Home Medications    Prior to Admission medications   Medication Sig Start Date End Date Taking? Authorizing Provider  aspirin  EC 81 MG tablet Take 81 mg by mouth daily. Swallow whole.    [provider]  atorvastatin   (LIPITOR) 80 MG tablet Take 1 tablet (80 mg total) by mouth daily. 01/23/23   Duke, Warren Haber, PA  clopidogrel  (PLAVIX ) 75 MG tablet Take 1 tablet (75 mg total) by mouth daily. 01/23/23   Duke, Warren Haber, PA  dapagliflozin  propanediol (FARXIGA ) 10 MG TABS tablet Take 1 tablet (10 mg total) by mouth daily. 01/23/23   Duke, Warren Haber, PA  ipratropium (ATROVENT ) 0.03 % nasal spray Place 1 spray into both nostrils 2 (two) times daily as needed for rhinitis. 01/25/23   Blitch, Dasie Epps, NP  irbesartan  (AVAPRO ) 75 MG tablet Take 1/2 tablet (37.5 mg total) by mouth daily. 01/23/23   Duke, Warren Haber, PA  metoprolol  succinate (TOPROL  XL) 25 MG 24 hr tablet Take 1 tablet (25 mg total) by mouth daily. 01/23/23   Duke, Warren Haber, PA  nitroGLYCERIN  (NITROSTAT ) 0.4 MG SL tablet Place 1 tablet (0.4 mg total) under the tongue every 5 (five) minutes as needed. 01/23/23   Duke, Warren Haber, PA  spironolactone  (ALDACTONE ) 25 MG tablet Take 1/2 tablet (12.5 mg total) by mouth daily. 01/23/23 04/24/23  Duke, Warren Haber, PA  Study - LIBREXIA-ACS - milvexian 25 mg or placebo tablet (PI-Stuckey) Take 1 tablet by mouth 2 (two) times daily. For investigational use. Take with or without food two times daily. Bring bottle back to every visit. 02/24/23   Kristopher Pheasant, MD  tiZANidine  (ZANAFLEX ) 4 MG tablet Take 1 tablet (4 mg total) by mouth every 6 (six) hours as needed for muscle spasms. Patient not taking: Reported on 02/24/2023 01/08/23   Currieo, Andrew D, MD    Family History History reviewed. No pertinent family history.  Social History Social History   Tobacco Use   Smoking status: Former    Current packs/day: 0.00    Types: Cigarettes    Quit date: 04/2022    Years since quitting: 1.1   Smokeless tobacco: Never  Vaping Use   Vaping status: Former  Substance Use Topics   Alcohol use: No   Drug use: No     Allergies   Patient has no known allergies.   Review of Systems Review of  Systems  Constitutional:  Positive for fatigue.  HENT:  Positive for congestion and sore throat.   Respiratory:  Positive for cough.   Cardiovascular: Negative.   Gastrointestinal: Negative.   Musculoskeletal: Negative.   Psychiatric/Behavioral: Negative.       Physical Exam Triage Vital Signs ED Triage Vitals  Encounter Vitals Group     BP 05/15/23 0817 125/84     Systolic BP Percentile --      Diastolic BP Percentile --      Pulse Rate 05/15/23 0817 89     Resp 05/15/23 0817 16     Temp 05/15/23 0817 98 F (36.7 C)     Temp Source 05/15/23 0817 Oral     SpO2 05/15/23 0817 96 %     Weight --      Height --      Head Circumference --      Peak Flow --      Pain Score 05/15/23 0819 7     Pain Loc --      Pain Education --      Exclude from Growth Chart --    No data found.  Updated Vital Signs BP 125/84 (BP Location: Right Arm)   Pulse 89   Temp 98 F (36.7 C) (Oral)   Resp 16   SpO2 96%   Visual Acuity Right Eye Distance:   Left Eye Distance:   Bilateral Distance:    Right Eye Near:   Left Eye Near:    Bilateral Near:     Physical Exam Constitutional:      General: He is not in acute distress.    Appearance: Normal appearance. He is normal weight. He is not ill-appearing or toxic-appearing.  HENT:     Nose: Congestion present. No rhinorrhea.     Mouth/Throat:     Mouth: Mucous membranes are moist.  Cardiovascular:     Rate and Rhythm: Normal rate and regular rhythm.  Pulmonary:     Effort: Pulmonary effort is normal.     Breath sounds: Normal breath sounds. No wheezing or rhonchi.  Musculoskeletal:     Cervical back: Normal range of motion and neck supple. No tenderness.  Lymphadenopathy:     Cervical: No cervical adenopathy.  Skin:    General: Skin is warm.  Neurological:     General: No focal deficit present.     Mental Status: He is alert.  Psychiatric:        Mood and Affect: Mood normal.      UC Treatments / Results  Labs (all  labs ordered are listed, but only abnormal results are displayed) Labs Reviewed  POC COVID19/FLU A&B COMBO    EKG   Radiology No results found.  Procedures Procedures (including critical care time)  Medications Ordered in UC Medications - No data to display  Initial Impression / Assessment and Plan / UC Course  I have reviewed the triage vital signs and the nursing notes.  Pertinent labs & imaging results that were available during my care of the patient were reviewed by me and considered in my medical decision making (see chart for details).   Final Clinical Impressions(s) / UC Diagnoses   Final diagnoses:  Upper respiratory tract infection, unspecified type     Discharge Instructions      You were seen today for upper respiratory symptoms.  Your flu/covid swab were negative today.  Your symptoms are likely due to another virus.  I have sent out a medication for cough, as well as an inhaler to use for any shortness of breath you  may feel.  Please use tylenol /motrin  for pain.  Please get plenty of rest and fluids.  Follow up if not improving as expected.   ED Prescriptions     Medication Sig Dispense Auth. Provider   benzonatate (TESSALON) 200 MG capsule Take 1 capsule (200 mg total) by mouth 3 (three) times daily as needed for cough. 21 capsule Evens Meno, MD   albuterol (VENTOLIN HFA) 108 (90 Base) MCG/ACT inhaler Inhale 2 puffs into the lungs every 4 (four) hours as needed for wheezing or shortness of breath. 1 each Lesle Ras, MD      PDMP not reviewed this encounter.   Lesle Ras, MD 05/15/23 [phone]

## 2023-05-20 ENCOUNTER — Encounter

## 2023-05-20 VITALS — BP 114/69 | HR 65 | Temp 98.7°F

## 2023-05-20 DIAGNOSIS — Z006 Encounter for examination for normal comparison and control in clinical research program: Secondary | ICD-10-CM

## 2023-05-20 MED ORDER — STUDY - LIBREXIA-ACS - MILVEXIAN 25 MG OR PLACEBO TABLET (PI-STUCKEY): 1.0000 | ORAL_TABLET | Freq: Two times a day (BID) | ORAL | 0 refills | Status: DC

## 2023-05-20 NOTE — Research (Addendum)
 LIBREXIA ACS 26 WEEK VISIT   [facility] Hematology/Chemistry drawn: [x] Yes [] No   PK/Biomarkers drawn: [x] Yes [] No   EQ-5D-5L Assessment Completed? [x] Yes [] No Reason not done: [] Subject Forgot [] Subject too ill [] Subject refused [] Technical failure [] Other If Other, Specify Collection Date: 13/May/2025   Medication Kit Accountability Dispensed Status [x] Dispensed [] Dispensed In Error [] Not Dispensed If 'Not Dispensed', provide reason: [] Medical Decision [] Medication Kit not available or damaged [] Study medication discontinued [] Visit skipped or administration skipped [] Medication kit dispensed in error Date Dispensed: 13/May/2025 Amount Dispensed: [id number] (Milvexian) 25mg  or Placebo [phone]  [id number] (Milvexian) 25mg  or Placebo [phone]  [id number] (Milvexian) 25mg  or Placebo [phone]  [id number] (Milvexian) 25mg  or Placebo [phone]  Return Status: [] Damaged/Returned by subject [] Missing/Not returned by subject [x] Returned by subject If not returned reasons: [] Forgot [] Lost Date Returned: 13/May/2025 Amount Returned:  [phone] 70 tablets [phone] 0 tablets [phone] tablets [phone] 43 tablets- subject took one dose at 1011 in clinic post PK draw and 42 tabs returned to pharmacy. Subject last took study drug prior to PK draw at 2100 on 12/May/2025.   Compliance%: 99   Were any suspected endpoint events or adverse events experienced? [x] Yes [] No Subject went to urgent care for ongoing URI symptoms that started at the 05/May/2025 per subject.   Subject re consented with ICF protocol 6.0 and copy given to subject.    Current Outpatient Medications:    albuterol  (VENTOLIN  HFA) 108 (90 Base) MCG/ACT inhaler, Inhale 2 puffs into the lungs every 4 (four) hours as needed for wheezing or shortness of breath., Disp: 18 g, Rfl: 0   aspirin  EC 81 MG tablet, Take 81 mg by mouth daily. Swallow whole., Disp: , Rfl:    atorvastatin  (LIPITOR) 80 MG tablet, Take 1  tablet (80 mg total) by mouth daily., Disp: 90 tablet, Rfl: 3   benzonatate  (TESSALON ) 200 MG capsule, Take 1 capsule (200 mg total) by mouth 3 (three) times daily as needed for cough., Disp: 21 capsule, Rfl: 0   clopidogrel  (PLAVIX ) 75 MG tablet, Take 1 tablet (75 mg total) by mouth daily., Disp: 90 tablet, Rfl: 3   dapagliflozin  propanediol (FARXIGA ) 10 MG TABS tablet, Take 1 tablet (10 mg total) by mouth daily., Disp: 90 tablet, Rfl: 3   irbesartan  (AVAPRO ) 75 MG tablet, Take 1/2 tablet (37.5 mg total) by mouth daily., Disp: 90 tablet, Rfl: 0   metoprolol  succinate (TOPROL  XL) 25 MG 24 hr tablet, Take 1 tablet (25 mg total) by mouth daily., Disp: 90 tablet, Rfl: 3   nitroGLYCERIN  (NITROSTAT ) 0.4 MG SL tablet, Place 1 tablet (0.4 mg total) under the tongue every 5 (five) minutes as needed., Disp: 25 tablet, Rfl: 2   Study - LIBREXIA-ACS - milvexian 25 mg or placebo tablet (PI-Stuckey), Take 1 tablet by mouth 2 (two) times daily. For investigational use. Take with or without food two times daily. Bring bottle back to every visit., Disp: 280 tablet, Rfl: 0   tiZANidine  (ZANAFLEX ) 4 MG tablet, Take 1 tablet (4 mg total) by mouth every 6 (six) hours as needed for muscle spasms., Disp: 30 tablet, Rfl: 0   ipratropium (ATROVENT ) 0.03 % nasal spray, Place 1 spray into both nostrils 2 (two) times daily as needed for rhinitis. (Patient not taking: Reported on 05/20/2023), Disp: 30 mL, Rfl: 12   spironolactone  (ALDACTONE ) 25 MG tablet, Take 1/2 tablet (12.5 mg total) by mouth daily., Disp: 45 tablet, Rfl: 3

## 2023-05-22 NOTE — Research (Addendum)
 Are there any labs that are clinically significant?  Yes []  OR No[x]   Is the patient eligible to continue enrollment in the study after screening visit?  Yes [x]   OR No[]   Labs Reviewed in detail.  Patient may continue in the study.  TS

## 2023-05-23 ENCOUNTER — Ambulatory Visit (HOSPITAL_COMMUNITY)
Admission: EM | Admit: 2023-05-23 | Discharge: 2023-05-23 | Disposition: A | Discharge status: Home / Self Care | Attending: Emergency Medicine | Admitting: Emergency Medicine

## 2023-05-23 ENCOUNTER — Ambulatory Visit (INDEPENDENT_AMBULATORY_CARE_PROVIDER_SITE_OTHER)

## 2023-05-23 ENCOUNTER — Encounter (HOSPITAL_COMMUNITY): Payer: Self-pay

## 2023-05-23 ENCOUNTER — Other Ambulatory Visit (HOSPITAL_COMMUNITY): Payer: Self-pay

## 2023-05-23 DIAGNOSIS — J189 Pneumonia, unspecified organism: Secondary | ICD-10-CM

## 2023-05-23 DIAGNOSIS — R051 Acute cough: Secondary | ICD-10-CM

## 2023-05-23 MED ORDER — CYCLOBENZAPRINE HCL 10 MG PO TABS
10.0000 mg | ORAL_TABLET | Freq: Two times a day (BID) | ORAL | 0 refills | Status: DC | PRN
  Filled 2023-05-23: qty 20, 10d supply, fill #0

## 2023-05-23 MED ORDER — DOXYCYCLINE HYCLATE 100 MG PO CAPS
100.0000 mg | ORAL_CAPSULE | Freq: Two times a day (BID) | ORAL | 0 refills | Status: AC
  Filled 2023-05-23: qty 14, 7d supply, fill #0

## 2023-05-23 NOTE — ED Provider Notes (Signed)
 [facility]    CSN: [id number] Arrival date & time: 05/23/23  1225      History   Chief Complaint Chief Complaint  Patient presents with   Abdominal Pain    HPI Wayne Huff is a 53 y.o. male.  Here with almost 2 week history of cough Was seen at onset, negative covid/flu. Given tessalon   2 days ago he started having pain in his left upper abdomen with coughing. Not having pain at rest. Denies shortness of breath or wheezing. No nausea/vomiting or diarrhea/constipation.  Denies having fever  Past Medical History:  Diagnosis Date   Arthritis    Back pain    Heart attack [facility])    Hypertension     Patient Active Problem List   Diagnosis Date Noted   Hyperlipidemia 11/26/2022   Acute heart failure with mildly reduced ejection fraction (HFmrEF, 41-49%) (HCC) 11/26/2022   Acute ST elevation myocardial infarction (STEMI) of inferior wall (HCC) 11/23/2022   DENTAL PAIN 05/29/2006    Past Surgical History:  Procedure Laterality Date   CORONARY STENT INTERVENTION N/A 11/25/2022   Procedure: CORONARY STENT INTERVENTION;  Surgeon: Arleen Lacer, MD;  [facility];  Service: Cardiovascular;  Laterality: N/A;   CORONARY/GRAFT ACUTE MI REVASCULARIZATION N/A 11/23/2022   Procedure: Coronary/Graft Acute MI Revascularization;  Surgeon: Arleen Lacer, MD;  Location: [facility] INVASIVE CV LAB;  Service: Cardiovascular;  Laterality: N/A;   ELBOW SURGERY     LEFT HEART CATH AND CORONARY ANGIOGRAPHY N/A 11/23/2022   Procedure: LEFT HEART CATH AND CORONARY ANGIOGRAPHY;  Surgeon: Arleen Lacer, MD;  Location: [facility] INVASIVE CV LAB;  Service: Cardiovascular;  Laterality: N/A;       Home Medications    Prior to Admission medications   Medication Sig Start Date End Date Taking? Authorizing Provider  cyclobenzaprine (FLEXERIL) 10 MG tablet Take 1 tablet (10 mg total) by mouth 2 (two) times daily as needed for muscle spasms. 05/23/23  Yes Roberth Berling, Ivette Marks, PA-C   doxycycline (VIBRAMYCIN) 100 MG capsule Take 1 capsule (100 mg total) by mouth 2 (two) times daily for 7 days. 05/23/23 05/30/23 Yes Rector Devonshire, Ivette Marks, PA-C  albuterol  (VENTOLIN  HFA) 108 (90 Base) MCG/ACT inhaler Inhale 2 puffs into the lungs every 4 (four) hours as needed for wheezing or shortness of breath. 05/15/23   Piontek, Cleveland Dales, MD  aspirin  EC 81 MG tablet Take 81 mg by mouth daily. Swallow whole.    [provider]  atorvastatin  (LIPITOR) 80 MG tablet Take 1 tablet (80 mg total) by mouth daily. 01/23/23   Duke, Warren Haber, PA  benzonatate  (TESSALON ) 200 MG capsule Take 1 capsule (200 mg total) by mouth 3 (three) times daily as needed for cough. 05/15/23   Piontek, Cleveland Dales, MD  clopidogrel  (PLAVIX ) 75 MG tablet Take 1 tablet (75 mg total) by mouth daily. 01/23/23   Duke, Warren Haber, PA  dapagliflozin  propanediol (FARXIGA ) 10 MG TABS tablet Take 1 tablet (10 mg total) by mouth daily. 01/23/23   Duke, Warren Haber, PA  ipratropium (ATROVENT ) 0.03 % nasal spray Place 1 spray into both nostrils 2 (two) times daily as needed for rhinitis. Patient not taking: Reported on 05/20/2023 01/25/23   Daryel Ensign, NP  irbesartan  (AVAPRO ) 75 MG tablet Take 1/2 tablet (37.5 mg total) by mouth daily. 01/23/23   Duke, Warren Haber, PA  metoprolol  succinate (TOPROL  XL) 25 MG 24 hr tablet Take 1 tablet (25 mg total) by mouth daily. 01/23/23   Duke,  Warren Haber, PA  nitroGLYCERIN  (NITROSTAT ) 0.4 MG SL tablet Place 1 tablet (0.4 mg total) under the tongue every 5 (five) minutes as needed. 01/23/23   Duke, Warren Haber, PA  spironolactone  (ALDACTONE ) 25 MG tablet Take 1/2 tablet (12.5 mg total) by mouth daily. 01/23/23 04/24/23  Duke, Warren Haber, PA  Study - LIBREXIA-ACS - milvexian 25 mg or placebo tablet (PI-Stuckey) Take 1 tablet by mouth 2 (two) times daily. For investigational use. Take with or without food two times daily. Bring bottle back to every visit. 05/20/23   Kristopher Pheasant, MD    Family  History History reviewed. No pertinent family history.  Social History Social History   Tobacco Use   Smoking status: Former    Current packs/day: 0.00    Types: Cigarettes    Quit date: 04/2022    Years since quitting: 1.1   Smokeless tobacco: Never  Vaping Use   Vaping status: Former  Substance Use Topics   Alcohol use: No   Drug use: No     Allergies   Patient has no known allergies.   Review of Systems Review of Systems Per HPI  Physical Exam Triage Vital Signs ED Triage Vitals  Encounter Vitals Group     BP 05/23/23 1253 116/79     Systolic BP Percentile --      Diastolic BP Percentile --      Pulse Rate 05/23/23 1253 63     Resp 05/23/23 1253 16     Temp 05/23/23 1253 98 F (36.7 C)     Temp Source 05/23/23 1253 Oral     SpO2 05/23/23 1253 95 %     Weight --      Height --      Head Circumference --      Peak Flow --      Pain Score 05/23/23 1254 0     Pain Loc --      Pain Education --      Exclude from Growth Chart --    No data found.  Updated Vital Signs BP 116/79 (BP Location: Left Arm)   Pulse 63   Temp 98 F (36.7 C) (Oral)   Resp 16   SpO2 95%    Physical Exam Vitals and nursing note reviewed.  Constitutional:      General: He is not in acute distress.    Appearance: Normal appearance. He is not ill-appearing or diaphoretic.  HENT:     Mouth/Throat:     Mouth: Mucous membranes are moist.     Pharynx: Oropharynx is clear.  Eyes:     Conjunctiva/sclera: Conjunctivae normal.  Cardiovascular:     Rate and Rhythm: Normal rate and regular rhythm.     Heart sounds: Normal heart sounds.  Pulmonary:     Effort: Pulmonary effort is normal. No respiratory distress.     Breath sounds: Normal breath sounds.  Abdominal:     General: Abdomen is protuberant. Bowel sounds are normal.     Palpations: Abdomen is soft.     Tenderness: There is abdominal tenderness. There is no right CVA tenderness, left CVA tenderness, guarding or rebound.        Comments: Large belly limits exam. Some tenderness left upper quadrant. No guarding or rebound   Musculoskeletal:        General: Normal range of motion.  Skin:    General: Skin is warm and dry.  Neurological:     Mental Status: He is alert and  oriented to person, place, and time.     UC Treatments / Results  Labs (all labs ordered are listed, but only abnormal results are displayed) Labs Reviewed - No data to display  EKG   Radiology DG Chest 2 View Result Date: 05/23/2023 CLINICAL DATA:  Cough for 2 weeks EXAM: CHEST - 2 VIEW COMPARISON:  Chest x-ray 06/25/2018 FINDINGS: No pneumothorax, effusion or edema. Normal cardiopericardial silhouette. Subtle opacity seen at the lingula laterally. Subtle infiltrate is possible. Recommend follow-up IMPRESSION: Subtle opacity along the lingula inferolateral. Infiltrate is possible. Recommend short follow-up to confirm clearance Electronically Signed   By: Adrianna Horde M.D.   On: 05/23/2023 13:53    Procedures Procedures (including critical care time)  Medications Ordered in UC Medications - No data to display  Initial Impression / Assessment and Plan / UC Course  I have reviewed the triage vital signs and the nursing notes.  Pertinent labs & imaging results that were available during my care of the patient were reviewed by me and considered in my medical decision making (see chart for details).  Afebrile, stable vitals. Clear lungs. Chest xray with possible infiltrate, subtle opacity left lower lobe. Cover with doxycycline BID x 7 days We did discuss his abdominal pain could be related to pneumonia, and likely abdominal wall strain from increased pressure while coughing. Can try OTC pain medications and flexeril. No red flags. Monitor symptoms. I recommend repeat xray in about 3 weeks to confirm resolution of opacity. Can return here or contact PCP for outpatient imaging. Return and ED precautions. Patient agrees to plan, no  questions at this time.   Final Clinical Impressions(s) / UC Diagnoses   Final diagnoses:  Acute cough  Pneumonia of left lung due to infectious organism, unspecified part of lung     Discharge Instructions      There is a small area of infection in your lung. Please take the doxycycline twice daily for 7 days in a row Take with food to avoid upset stomach.  This lung infection may be the cause of your abdominal pain.  You may also have a strain of the abdominal wall from coughing.  Use ibuprofen  or Tylenol  as needed for pain.  You can also try the Flexeril which is a muscle relaxer.  Be cautious as this might make you drowsy.  If so take only at bedtime.  Please have repeat chest x-ray in about 3 weeks.  You can return here, or contact your primary care provider  Please go to the emergency department if symptoms worsen or become severe.   ED Prescriptions     Medication Sig Dispense Auth. Provider   doxycycline (VIBRAMYCIN) 100 MG capsule Take 1 capsule (100 mg total) by mouth 2 (two) times daily for 7 days. 14 capsule Adrie Picking, PA-C   cyclobenzaprine (FLEXERIL) 10 MG tablet Take 1 tablet (10 mg total) by mouth 2 (two) times daily as needed for muscle spasms. 20 tablet Lavonda Thal, Ivette Marks, PA-C      PDMP not reviewed this encounter.   Creighton Doffing, [place] 05/23/23 1441

## 2023-05-23 NOTE — ED Triage Notes (Signed)
 Pt states he has had a cough for a few weeks States 2 days ago he coughed and now is having a pain in his stomach every time he coughs.

## 2023-05-23 NOTE — Discharge Instructions (Addendum)
 There is a small area of infection in your lung. Please take the doxycycline twice daily for 7 days in a row Take with food to avoid upset stomach.  This lung infection may be the cause of your abdominal pain.  You may also have a strain of the abdominal wall from coughing.  Use ibuprofen  or Tylenol  as needed for pain.  You can also try the Flexeril which is a muscle relaxer.  Be cautious as this might make you drowsy.  If so take only at bedtime.  Please have repeat chest x-ray in about 3 weeks.  You can return here, or contact your primary care provider  Please go to the emergency department if symptoms worsen or become severe.

## 2023-05-26 ENCOUNTER — Other Ambulatory Visit (HOSPITAL_COMMUNITY): Payer: Self-pay

## 2023-05-27 NOTE — ED Provider Notes (Signed)
 [facility]    CSN: [id number] Arrival date & time: 01/25/23  1100      History   Chief Complaint Chief Complaint  Patient presents with   Nasal Congestion    HPI Wayne Huff is a 53 y.o. male.   Patient here today with c/o nasal congestion, headache, sneezing, and fatigue since yesterday.  He denies any fever/ nausea/vomiting/diarrhea . Reports mild cough, non productive.   Has been exposed to sick co workers.       Past Medical History:  Diagnosis Date   Arthritis    Back pain    Heart attack [facility])    Hypertension     Patient Active Problem List   Diagnosis Date Noted   Hyperlipidemia 11/26/2022   Acute heart failure with mildly reduced ejection fraction (HFmrEF, 41-49%) (HCC) 11/26/2022   Acute ST elevation myocardial infarction (STEMI) of inferior wall (HCC) 11/23/2022   DENTAL PAIN 05/29/2006    Past Surgical History:  Procedure Laterality Date   CORONARY STENT INTERVENTION N/A 11/25/2022   Procedure: CORONARY STENT INTERVENTION;  Surgeon: Arleen Lacer, MD;  Location: [facility];  Service: Cardiovascular;  Laterality: N/A;   CORONARY/GRAFT ACUTE MI REVASCULARIZATION N/A 11/23/2022   Procedure: Coronary/Graft Acute MI Revascularization;  Surgeon: Arleen Lacer, MD;  Location: [facility] INVASIVE CV LAB;  Service: Cardiovascular;  Laterality: N/A;   ELBOW SURGERY     LEFT HEART CATH AND CORONARY ANGIOGRAPHY N/A 11/23/2022   Procedure: LEFT HEART CATH AND CORONARY ANGIOGRAPHY;  Surgeon: Arleen Lacer, MD;  Location: [facility] INVASIVE CV LAB;  Service: Cardiovascular;  Laterality: N/A;       Home Medications    Prior to Admission medications   Medication Sig Start Date End Date Taking? Authorizing Provider  ipratropium (ATROVENT ) 0.03 % nasal spray Place 1 spray into both nostrils 2 (two) times daily as needed for rhinitis. Patient not taking: Reported on 05/20/2023 01/25/23  Yes Calista Crain, Dasie Epps, NP  albuterol  (VENTOLIN  HFA) 108 (90 Base)  MCG/ACT inhaler Inhale 2 puffs into the lungs every 4 (four) hours as needed for wheezing or shortness of breath. 05/15/23   Piontek, Cleveland Dales, MD  aspirin  EC 81 MG tablet Take 81 mg by mouth daily. Swallow whole.    [provider]  atorvastatin  (LIPITOR) 80 MG tablet Take 1 tablet (80 mg total) by mouth daily. 01/23/23   Duke, Warren Haber, PA  benzonatate  (TESSALON ) 200 MG capsule Take 1 capsule (200 mg total) by mouth 3 (three) times daily as needed for cough. 05/15/23   Piontek, Cleveland Dales, MD  clopidogrel  (PLAVIX ) 75 MG tablet Take 1 tablet (75 mg total) by mouth daily. 01/23/23   Duke, Warren Haber, PA  cyclobenzaprine  (FLEXERIL ) 10 MG tablet Take 1 tablet (10 mg total) by mouth 2 (two) times daily as needed for muscle spasms. 05/23/23   Rising, Ivette Marks, PA-C  dapagliflozin  propanediol (FARXIGA ) 10 MG TABS tablet Take 1 tablet (10 mg total) by mouth daily. 01/23/23   Duke, Warren Haber, PA  doxycycline  (VIBRAMYCIN ) 100 MG capsule Take 1 capsule (100 mg total) by mouth 2 (two) times daily for 7 days. 05/23/23 05/30/23  Rising, Ivette Marks, PA-C  irbesartan  (AVAPRO ) 75 MG tablet Take 1/2 tablet (37.5 mg total) by mouth daily. 01/23/23   Duke, Warren Haber, PA  metoprolol  succinate (TOPROL  XL) 25 MG 24 hr tablet Take 1 tablet (25 mg total) by mouth daily. 01/23/23   Duke, Warren Haber, PA  nitroGLYCERIN  (NITROSTAT ) 0.4 MG SL tablet  Place 1 tablet (0.4 mg total) under the tongue every 5 (five) minutes as needed. 01/23/23   Duke, Warren Haber, PA  spironolactone  (ALDACTONE ) 25 MG tablet Take 1/2 tablet (12.5 mg total) by mouth daily. 01/23/23 08/24/23  Duke, Warren Haber, PA  Study - LIBREXIA-ACS - milvexian 25 mg or placebo tablet (PI-Stuckey) Take 1 tablet by mouth 2 (two) times daily. For investigational use. Take with or without food two times daily. Bring bottle back to every visit. 05/20/23   Kristopher Pheasant, MD    Family History History reviewed. No pertinent family history.  Social History Social  History   Tobacco Use   Smoking status: Former    Current packs/day: 0.00    Types: Cigarettes    Quit date: 04/2022    Years since quitting: 1.1   Smokeless tobacco: Never  Vaping Use   Vaping status: Former  Substance Use Topics   Alcohol use: No   Drug use: No     Allergies   Patient has no known allergies.   Review of Systems Review of Systems  Constitutional:  Positive for fatigue. Negative for chills, diaphoresis and fever.  HENT:  Positive for congestion, postnasal drip and sneezing. Negative for ear pain and sinus pain.   Respiratory:  Positive for cough. Negative for shortness of breath.   All other systems reviewed and are negative.    Physical Exam Triage Vital Signs ED Triage Vitals  Encounter Vitals Group     BP 01/25/23 1153 110/70     Systolic BP Percentile --      Diastolic BP Percentile --      Pulse Rate 01/25/23 1153 61     Resp 01/25/23 1153 16     Temp 01/25/23 1153 98.7 F (37.1 C)     Temp Source 01/25/23 1153 Oral     SpO2 01/25/23 1153 95 %     Weight 01/25/23 1154 185 lb (83.9 kg)     Height 01/25/23 1154 5\' 7"  (1.702 m)     Head Circumference --      Peak Flow --      Pain Score 01/25/23 1155 1     Pain Loc --      Pain Education --      Exclude from Growth Chart --    No data found.  Updated Vital Signs BP 110/70 (BP Location: Right Arm)   Pulse 61   Temp 98.7 F (37.1 C) (Oral)   Resp 16   Ht 5\' 7"  (1.702 m)   Wt 83.9 kg   SpO2 95%   BMI 28.98 kg/m   Visual Acuity Right Eye Distance:   Left Eye Distance:   Bilateral Distance:    Right Eye Near:   Left Eye Near:    Bilateral Near:     Physical Exam   UC Treatments / Results  Labs (all labs ordered are listed, but only abnormal results are displayed) Labs Reviewed  POC COVID19/FLU A&B COMBO    EKG   Radiology No results found.  Procedures Procedures (including critical care time)  Medications Ordered in UC Medications - No data to  display  Initial Impression / Assessment and Plan / UC Course  I have reviewed the triage vital signs and the nursing notes.  Pertinent labs & imaging results that were available during my care of the patient were reviewed by me and considered in my medical decision making (see chart for details).  Patient has signs of viral exposure.  No antibiotics warranted at this time.  Education given on self care.  Congestion was a major issue and will be treated with atrovent  at this time.  Education given to patient on no anbiotics and red flag symptoms reviewed.  He is agreeable at this time.     Final Clinical Impressions(s) / UC Diagnoses   Final diagnoses:  Acute nasopharyngitis (common cold)   Discharge Instructions      You were negative for flu and COVID.  Antibiotic with not treat a virus.   You are at high risk for complications.  So if you start feeling worse please follow up with someone.   Atrovent  for congestion Mucinex 600mg   twice daily if needed for cough - OTC medication Salt water gargles for sore throat and mucus Tylenol  1000mg  three times a day for generalized body aches and fever Ensure hydration Consider adding on Zinc and Vit C for immunity  Follow up with primary care or urgent care if symptoms continue greater than one week   ED Prescriptions     Medication Sig Dispense Auth. Provider   ipratropium (ATROVENT ) 0.03 % nasal spray Place 1 spray into both nostrils 2 (two) times daily as needed for rhinitis. Patient not taking:  Reported on 05/20/2023 30 mL Undray Allman, Dasie Epps, NP      PDMP not reviewed this encounter.   Daryel Ensign, NP 05/27/23 1003

## 2023-06-12 NOTE — Research (Signed)
 Rande Bushy Informed Consent   Subject Name: Wayne Huff  Subject met inclusion and exclusion criteria.  The informed consent form, study requirements and expectations were reviewed with the subject and questions and concerns were addressed prior to the signing of the consent form.  The subject verbalized understanding of the trial requirements.  The subject agreed to participate in the [place] trial and signed the informed consent at 1000 on 13/May/2025.  The informed consent was obtained prior to performance of any protocol-specific procedures for the subject.  A copy of the signed informed consent was given to the subject and a copy was placed in the subject's medical record.     Subject re-consented to  Version: 6.0 IRB approved: 17/Mar/2025  [name] [name]

## 2023-07-03 ENCOUNTER — Other Ambulatory Visit (HOSPITAL_COMMUNITY): Payer: Self-pay

## 2023-07-23 ENCOUNTER — Telehealth: Payer: Self-pay | Admitting: Cardiology

## 2023-07-23 ENCOUNTER — Other Ambulatory Visit: Payer: Self-pay | Admitting: Physician Assistant

## 2023-07-23 ENCOUNTER — Other Ambulatory Visit (HOSPITAL_COMMUNITY): Payer: Self-pay

## 2023-07-23 DIAGNOSIS — I252 Old myocardial infarction: Secondary | ICD-10-CM

## 2023-07-23 DIAGNOSIS — I5022 Chronic systolic (congestive) heart failure: Secondary | ICD-10-CM

## 2023-07-23 MED ORDER — IRBESARTAN 75 MG PO TABS
37.5000 mg | ORAL_TABLET | Freq: Every day | ORAL | 1 refills | Status: DC
  Filled 2023-07-23: qty 45, 90d supply, fill #0
  Filled 2023-10-20: qty 45, 90d supply, fill #1

## 2023-07-23 NOTE — Telephone Encounter (Signed)
 Pt c/o medication issue:  1. Name of Medication: irbesartan  (AVAPRO ) 75 MG tablet   2. How are you currently taking this medication (dosage and times per day)? 09  3. Are you having a reaction (difficulty breathing--STAT)? no  4. What is your medication issue? Calling to see if the patient really needs to be on the medication

## 2023-07-23 NOTE — Telephone Encounter (Signed)
 RX sent to requested Pharmacy

## 2023-07-23 NOTE — Telephone Encounter (Signed)
 Reviewed w patient that avapro  was refilled today at the downstairs pharmacy.

## 2023-07-23 NOTE — Telephone Encounter (Signed)
*  STAT* If patient is at the pharmacy, call can be transferred to refill team.   1. Which medications need to be refilled? (please list name of each medication and dose if known)   irbesartan  (AVAPRO ) 75 MG tablet   2. Which pharmacy/location (including street and city if local pharmacy) is medication to be sent to? [place] [facility]   3. Do they need a 30 day or 90 day supply? 90

## 2023-07-27 NOTE — Progress Notes (Unsigned)
 Cardiology Office Note   Date:  07/30/2023  ID:  Wayne Huff, DOB 12/16/1970, MRN [id number] PCP: Patient, No Pcp Per  [facility] Providers Cardiologist:  Alm Clay, MD   History of Present Illness Wayne Huff is a 53 y.o. male with a past medical history significant for CAD status post STEMI, hyperlipidemia, chronic systolic heart failure, and hyperlipidemia here for follow-up appointment.  History includes he was admitted back November of last year with STEMI.  Heart catheterization 11/23/2022 showed severe two-vessel disease with the culprit lesion felt to be 100% thrombotic occlusion of the RCA between sidebranches that was treated with a long DES 2.75 x 30 mm of the proximal to mid RCA.  There is also 80% focal stenosis in the second marginal and he underwent staged PCI on 11/25/2022 with DES 2.25 x 28 mm covering 2 lesions in the second marginal.  Echo 11/26/2022 showed LVEF 45 to 50% with RWMA, normal RV.  He was seen in follow-up later in November and reported discomfort that was felt to be related to GERD.  He then presented in January of this year and continued to have issues with GERD after eating acidic food.  Drinks a lot of [employer] and eats unhealthy foods per the patient.  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  No angina, SOB, lower extremity swelling.  Today, he presents with a history of myocardial infarction  with persistent fatigue and drowsiness.  He experiences persistent fatigue and drowsiness, feeling tired and sluggish, particularly when driving. He takes metoprolol  succinate 25 mg once daily and suspects it may cause drowsiness. He has considered taking it at night but continues to feel unrested.  He had a myocardial infarction in November 2024, with heart pump function normalizing by February 2025. He did not attend cardiac rehabilitation due to a missed referral.  He experiences sleep disturbances and likely insufficient sleep, which may  contribute to his fatigue. His work as an Biomedical scientist involves frequent driving, potentially impacting his rest and energy levels.  Reports no shortness of breath nor dyspnea on exertion. Reports no chest pain, pressure, or tightness. No edema, orthopnea, PND. Reports no palpitations.   Discussed the use of AI scribe software for clinical note transcription with the patient, who gave verbal consent to proceed.   ROS: pertinent Ros in hpi  Studies Reviewed      CARDIAC CATHETERIZATION   CARDIAC CATHETERIZATION 11/25/2022   Narrative   Prox LAD to Mid LAD lesion is 30% stenosed.   Lesion # 2: 2nd Mrg-1 lesion is 80% stenosed.  2nd Mrg-2 lesion is 60% stenosed.  TIMI-3 flow   A drug-eluting stent was successfully placed crossing both lesions, using a SYNERGY XD 2.25X28 => deployed to 2.5 mm.  Post intervention, there is a 0% residual stenosis in both lesions. TIMI-3 flow maintained   RECOMMENDATIONS   In the absence of any other complications or medical issues, we expect the patient to be ready for discharge from an interventional cardiology perspective on 11/26/2022.   Need time to titrate GDMT for CAD   Recommend uninterrupted dual antiplatelet therapy with Aspirin  81mg  daily and Ticagrelor  90mg  twice daily for a minimum of 12 months (ACS-Class I recommendation).   After 1 year we will stop aspirin  and continue Thienopyridine SAPT (Brilinta  60 mg BID or Plavix  75 mg daily)     Alm Clay, MD   Findings Coronary Findings Diagnostic  Dominance: Right   Left Anterior Descending Prox LAD  to Mid LAD lesion is 30% stenosed. The lesion is segmental and eccentric.   First Diagonal Branch Vessel is small in size.   Left Circumflex   First Obtuse Marginal Branch Vessel is small in size.   Second Obtuse Marginal Branch Vessel is moderate in size. 2nd Mrg-1 lesion is 80% stenosed. Vessel is not the culprit lesion. The lesion is type A, located at the bend, segmental and  concentric. 2nd Mrg-2 lesion is 60% stenosed.   Right Coronary Artery Vessel was not injected. Non-stenotic Prox RCA-1 lesion was previously treated. Non-stenotic Prox RCA-2 lesion was previously treated. Vessel is the culprit lesion. The lesion is located proximal to the major branch, segmental and thrombotic. Non-stenotic Prox RCA to Mid RCA lesion was previously treated. Mid RCA to Dist RCA lesion is 40% stenosed.   Right Ventricular Branch Vessel is small in size.   Right Posterior Atrioventricular Artery Vessel is small in size.   First Right Posterolateral Branch Vessel is small in size.   Second Right Posterolateral Branch Vessel is small in size.   Intervention   2nd Mrg-1 lesion Stent (Also treats lesions: 2nd Mrg-2) Lesion length:  26 mm. CATH LAUNCHER 6FR EBU3.5 guide catheter was inserted. Lesion crossed with guidewire using a WIRE ASAHI PROWATER 180CM. Pre-stent angioplasty was performed using a BALLN EMERGE MR 2.0X12. Maximum pressure:  10 atm. Inflation time: 20 sec. A drug-eluting stent was successfully placed using a SYNERGY XD 2.25X28. Same stent covers both lesions Maximum pressure: 12 atm. Inflation time: 30 sec. Stent strut is well apposed. Stent covers both lesions Post-stent angioplasty was performed. Maximum pressure:  16 atm. Inflation time:  20 sec. Stent balloon-high ATM Post-Intervention Lesion Assessment The intervention was successful. Pre-interventional TIMI flow is 3. Post-intervention TIMI flow is 3. Treated lesion length:  26 mm. No complications occurred at this lesion. There is a 0% residual stenosis post intervention.   2nd Mrg-2 lesion Stent (Also treats lesions: 2nd Mrg-1) See details in 2nd Mrg-1 lesion. Post-Intervention Lesion Assessment The intervention was successful. Pre-interventional TIMI flow is 3. Post-intervention TIMI flow is 3. Treated lesion length:  26 mm. No complications occurred at this lesion. There is a 0% residual  stenosis post intervention.     CARDIAC CATHETERIZATION 11/23/2022   Narrative   CULPRIT LESION Segment: Prox RCA-1 lesion is 60% stenosed.  Prox RCA-2 lesion is 100% stenosed.  Prox RCA to Mid RCA lesion is 90% stenosed.   A drug-eluting stent was successfully placed covering all 3 lesions, using a STENT ONYX FRONTIER 2.75X30 -> deployed to 2.9 mm.  Post intervention, there is a 0% residual stenosis.   Mid RCA to Dist RCA lesion is 40% stenosed.   ---------------------------------------------------------------   2nd Mrg lesion is 80% stenosed. ->  Consider staged PCI   Prox LAD to Mid LAD lesion is 30% stenosed.   ---------------------------------------------------------------   There is mild left ventricular systolic dysfunction. The left ventricular ejection fraction is 45-50% by visual estimate. LV end diastolic pressure is moderately elevated.   --------------------------------------------------------------.   POST-CATH DIAGNOSES Severe two-vessel disease: Culprit lesion is 100% thrombotic occlusion of the RCA between sidebranches; TIMI 0 flow Successful DES PCI of proximal to mid RCA with Synergy XD; TIMI-3 flow restored Proximal 2nd Mrg focal 80% Proximal LAD 30% at SP1 Mildly reduced LVEF with apparent inferior hypokinesis. EF 45 to 50%. LVEDP 24 mmHg.   RECOMMENDATIONS In the absence of any other complications or medical issues, we expect the patient to be ready for  discharge from an interventional cardiology perspective on 11/25/2022.   Will ask to interventional colleagues to review films, consider staged PCI of 2nd Mrg prior to discharge (could be discharged same day if done early enough). Check 2D echo   Recommend uninterrupted dual antiplatelet therapy with Aspirin  81mg  daily and Ticagrelor  90mg  twice daily for a minimum of 12 months (ACS-Class I recommendation).   Continue Thienopyridine SAPT monotherapy for second year.  (Okay to interrupt at that time) High-dose statin-80  mg atorvastatin  Consider reinitiating antihypertensives-beta-blocker/ARB once blood pressure stabilizes in the morning.       Alm Clay, MD   Findings Coronary Findings Diagnostic  Dominance: Right   Left Anterior Descending Prox LAD to Mid LAD lesion is 30% stenosed. The lesion is segmental and eccentric.   First Diagonal Branch Vessel is small in size.   Left Circumflex   First Obtuse Marginal Branch Vessel is small in size.   Second Obtuse Marginal Branch Vessel is moderate in size. 2nd Mrg lesion is 80% stenosed.   Right Coronary Artery Prox RCA-1 lesion is 60% stenosed. Prox RCA-2 lesion is 100% stenosed. Vessel is the culprit lesion. The lesion is located proximal to the major branch, segmental and thrombotic. Prox RCA to Mid RCA lesion is 90% stenosed. Mid RCA to Dist RCA lesion is 40% stenosed.   Right Ventricular Branch Vessel is small in size.   Right Posterior Atrioventricular Artery Vessel is small in size.   First Right Posterolateral Branch Vessel is small in size.   Second Right Posterolateral Branch Vessel is small in size.   Intervention   Prox RCA-1 lesion Stent (Also treats lesions: Prox RCA-2, and Prox RCA to Mid RCA) Lesion length:  29 mm. CATH VISTA GUIDE 6FR JR4 guide catheter was inserted. Lesion crossed with guidewire using a WIRE ASAHI PROWATER 180CM. Pre-stent angioplasty was performed using a BALLN SAPPHIRE 2.5X12. Maximum pressure:  10 atm. Inflation time:  20 sec. A drug-eluting stent was successfully placed using a STENT ONYX FRONTIER 2.75X30. Maximum pressure: 14 atm. Inflation time: 30 sec. Stent strut is well apposed. Post-stent angioplasty was performed. Maximum pressure:  20 atm. Inflation time:  15 sec. Stent balloon to high ATM-deployed to 2.9 mm Stent covers all 3 lesions Post-Intervention Lesion Assessment The intervention was successful. Pre-interventional TIMI flow is 0. Post-intervention TIMI flow is 3. No complications  occurred at this lesion. There is a 0% residual stenosis post intervention.   Prox RCA-2 lesion Stent (Also treats lesions: Prox RCA-1, and Prox RCA to Mid RCA) See details in Prox RCA-1 lesion. Post-Intervention Lesion Assessment The intervention was successful. Pre-interventional TIMI flow is 0. Post-intervention TIMI flow is 3. Treated lesion length:  30 mm. No complications occurred at this lesion. There is a 0% residual stenosis post intervention.   Prox RCA to Mid RCA lesion Stent (Also treats lesions: Prox RCA-1, and Prox RCA-2) See details in Prox RCA-1 lesion. Post-Intervention Lesion Assessment The intervention was successful. Pre-interventional TIMI flow is 0. Post-intervention TIMI flow is 3. Treated lesion length:  30 mm. No complications occurred at this lesion. There is a 0% residual stenosis post intervention.    ECHOCARDIOGRAM   ECHOCARDIOGRAM COMPLETE 11/26/2022   Narrative ECHOCARDIOGRAM REPORT       Patient Name:   Wayne Huff Date of Exam: 11/26/2022 Medical Rec #:  [id number]      Height:       67.0 in Accession #:    [id number]     Weight:  178.4 lb Date of Birth:  Feb 10, 1970       BSA:          1.926 m Patient Age:    52 years       BP:           123/82 mmHg Patient Gender: M              HR:           63 bpm. Exam Location:  Inpatient   Procedure: 2D Echo, Cardiac Doppler and Color Doppler   Indications:    CAD I25.10   History:        Patient has no prior history of Echocardiogram examinations. CAD and Previous Myocardial Infarction; Risk Factors:Former Smoker. 11/25/22-CORONARY STENT INTERVENTION.   Sonographer:    Tillman Nora RVT RCS Referring Phys: 71 DAVID [facility]     Sonographer Comments: Technically challenging study due to limited acoustic windows. IMPRESSIONS     1. Left ventricular ejection fraction, by estimation, is 45 to 50%. The left ventricle has mildly decreased function. The left ventricle demonstrates regional wall  motion abnormalities (see scoring diagram/findings for description). Left ventricular diastolic parameters are indeterminate. There is akinesis of the left ventricular, entire inferior wall. There is hypokinesis of the left ventricular, entire inferolateral wall. There is hypokinesis of the left ventricular, basal inferoseptal wall. 2. Right ventricular systolic function is normal. The right ventricular size is normal. Tricuspid regurgitation signal is inadequate for assessing PA pressure. 3. The mitral valve is normal in structure. Trivial mitral valve regurgitation. No evidence of mitral stenosis. 4. The aortic valve is tricuspid. Aortic valve regurgitation is not visualized. Aortic valve sclerosis/calcification is present, without any evidence of aortic stenosis. Aortic valve area, by VTI measures 2.46 cm. Aortic valve mean gradient measures 3.0 mmHg. Aortic valve Vmax measures 1.19 m/s. 5. The inferior vena cava is normal in size with greater than 50% respiratory variability, suggesting right atrial pressure of 3 mmHg. 6. Recommend repeat limited study with definity contrast as the inferolateral endocardium is not well visualized.   FINDINGS Left Ventricle: Left ventricular ejection fraction, by estimation, is 45 to 50%. The left ventricle has mildly decreased function. The left ventricle demonstrates regional wall motion abnormalities. The left ventricular internal cavity size was normal in size. There is no left ventricular hypertrophy. Left ventricular diastolic parameters are indeterminate. Normal left ventricular filling pressure.   Right Ventricle: The right ventricular size is normal. No increase in right ventricular wall thickness. Right ventricular systolic function is normal. Tricuspid regurgitation signal is inadequate for assessing PA pressure.   Left Atrium: Left atrial size was normal in size.   Right Atrium: Right atrial size was normal in size.   Pericardium: Trivial  pericardial effusion is present. The pericardial effusion is anterior to the right ventricle and localized near the right atrium.   Mitral Valve: The mitral valve is normal in structure. Trivial mitral valve regurgitation. No evidence of mitral valve stenosis.   Tricuspid Valve: The tricuspid valve is normal in structure. Tricuspid valve regurgitation is trivial. No evidence of tricuspid stenosis.   Aortic Valve: The aortic valve is tricuspid. Aortic valve regurgitation is not visualized. Aortic valve sclerosis/calcification is present, without any evidence of aortic stenosis. Aortic valve mean gradient measures 3.0 mmHg. Aortic valve peak gradient measures 5.7 mmHg. Aortic valve area, by VTI measures 2.46 cm.   Pulmonic Valve: The pulmonic valve was normal in structure. Pulmonic valve regurgitation is not visualized. No evidence of  pulmonic stenosis.   Aorta: The aortic root is normal in size and structure.   Venous: The inferior vena cava is normal in size with greater than 50% respiratory variability, suggesting right atrial pressure of 3 mmHg.   IAS/Shunts: No atrial level shunt detected by color flow Doppler.     LEFT VENTRICLE PLAX 2D LVIDd:         4.50 cm   Diastology LVIDs:         3.40 cm   LV e' medial:    7.18 cm/s LV PW:         1.20 cm   LV E/e' medial:  11.5 LV IVS:        0.90 cm   LV e' lateral:   11.70 cm/s LVOT diam:     1.90 cm   LV E/e' lateral: 7.0 LV SV:         51 LV SV Index:   27 LVOT Area:     2.84 cm     RIGHT VENTRICLE             IVC RV S prime:     13.80 cm/s  IVC diam: 1.80 cm TAPSE (M-mode): 1.9 cm   LEFT ATRIUM             Index       RIGHT ATRIUM          Index LA diam:        2.60 cm 1.35 cm/m  RA Area:     7.93 cm LA Vol (A2C):   17.3 ml 8.98 ml/m  RA Volume:   18.00 ml 9.35 ml/m LA Vol (A4C):   14.1 ml 7.32 ml/m LA Biplane Vol: 16.1 ml 8.36 ml/m AORTIC VALVE                    PULMONIC VALVE AV Area (Vmax):    2.55 cm     PV Vmax:        0.95 m/s AV Area (Vmean):   2.28 cm     PV Peak grad:  3.6 mmHg AV Area (VTI):     2.46 cm AV Vmax:           119.00 cm/s AV Vmean:          77.000 cm/s AV VTI:            0.209 m AV Peak Grad:      5.7 mmHg AV Mean Grad:      3.0 mmHg LVOT Vmax:         107.00 cm/s LVOT Vmean:        61.800 cm/s LVOT VTI:          0.181 m LVOT/AV VTI ratio: 0.87   AORTA Ao Root diam: 3.20 cm Ao Asc diam:  3.20 cm   MITRAL VALVE MV Area (PHT): 3.68 cm    SHUNTS MV Decel Time: 206 msec    Systemic VTI:  0.18 m MV E velocity: 82.30 cm/s  Systemic Diam: 1.90 cm MV A velocity: 87.00 cm/s MV E/A ratio:  0.95   Wilbert Bihari MD Electronically signed by Wilbert Bihari MD Signature Date/Time: 11/26/2022/10:10:27 AM       Final       Physical Exam VS:  BP (!) 143/81   Pulse 63   Ht 5' 7 (1.702 m)   Wt 203 lb 6.4 oz (92.3 kg)   SpO2 98%   BMI 31.86 kg/m  Wt Readings from Last 3 Encounters:  07/30/23 203 lb 6.4 oz (92.3 kg)  01/25/23 185 lb (83.9 kg)  01/23/23 184 lb 9.6 oz (83.7 kg)    GEN: Well nourished, well developed in no acute distress NECK: No JVD; No carotid bruits CARDIAC: RRR, no murmurs, rubs, gallops RESPIRATORY:  Clear to auscultation without rales, wheezing or rhonchi  ABDOMEN: Soft, non-tender, non-distended EXTREMITIES:  No edema; No deformity   ASSESSMENT AND PLAN  STEMI (ST-Elevation Myocardial Infarction) Persistent fatigue and drowsiness possibly due to metoprolol . Normal heart function (60-65% ejection fraction). Missed cardiac rehab post-STEMI. - Refer to cardiac rehab for exercise and nutrition education. - Advise metoprolol  intake at night to reduce drowsiness. -continue DAPT for a year, ASA and plavix   Hypertension Blood pressure well-managed but systolic pressure increasing. Current regimen: irbesartan , spironolactone , metoprolol . - Monitor blood pressure at home with reliable cuff. - Prescribe [place] blood pressure  cuff.  Hyperlipidemia Managed with Lipitor. -no labs needed at this time LDL 51 when checked in December  Pneumonia Recent diagnosis treated with antibiotics. - Order follow-up chest x-ray to ensure resolution.  Arthritis and back pain Arthritis and back pain with leg and foot numbness. Previous physical therapy. - Discuss cardiac rehab exercises for back pain benefits.     Cardiac Rehabilitation Eligibility Assessment  The patient is ready to start cardiac rehabilitation from a cardiac standpoint.      Dispo: He can follow-up in 6 months with Dr. Anner  Signed, Orren LOISE Fabry, PA-C

## 2023-07-30 ENCOUNTER — Other Ambulatory Visit (HOSPITAL_COMMUNITY): Payer: Self-pay

## 2023-07-30 ENCOUNTER — Ambulatory Visit: Attending: Physician Assistant | Admitting: Physician Assistant

## 2023-07-30 ENCOUNTER — Encounter: Payer: Self-pay | Admitting: Physician Assistant

## 2023-07-30 VITALS — BP 143/81 | HR 63 | Ht 67.0 in | Wt 203.4 lb

## 2023-07-30 DIAGNOSIS — E785 Hyperlipidemia, unspecified: Secondary | ICD-10-CM | POA: Diagnosis present

## 2023-07-30 DIAGNOSIS — I5022 Chronic systolic (congestive) heart failure: Secondary | ICD-10-CM | POA: Insufficient documentation

## 2023-07-30 DIAGNOSIS — Z9861 Coronary angioplasty status: Secondary | ICD-10-CM | POA: Insufficient documentation

## 2023-07-30 DIAGNOSIS — I10 Essential (primary) hypertension: Secondary | ICD-10-CM | POA: Insufficient documentation

## 2023-07-30 DIAGNOSIS — I2119 ST elevation (STEMI) myocardial infarction involving other coronary artery of inferior wall: Secondary | ICD-10-CM | POA: Insufficient documentation

## 2023-07-30 DIAGNOSIS — I252 Old myocardial infarction: Secondary | ICD-10-CM | POA: Diagnosis not present

## 2023-07-30 DIAGNOSIS — I2510 Atherosclerotic heart disease of native coronary artery without angina pectoris: Secondary | ICD-10-CM | POA: Diagnosis not present

## 2023-07-30 DIAGNOSIS — Z79899 Other long term (current) drug therapy: Secondary | ICD-10-CM | POA: Insufficient documentation

## 2023-07-30 DIAGNOSIS — E119 Type 2 diabetes mellitus without complications: Secondary | ICD-10-CM | POA: Insufficient documentation

## 2023-07-30 MED ORDER — BLOOD PRESSURE CUFF MISC: 0 refills | Status: AC

## 2023-07-30 MED ORDER — OMRON 3 SERIES BP MONITOR DEVI
0 refills | Status: AC
  Filled 2023-07-30: qty 1, 90d supply, fill #0

## 2023-07-30 NOTE — Patient Instructions (Addendum)
 Medication Instructions:   Your physician recommends that you continue on your current medications as directed. Please refer to the Current Medication list given to you today.  *If you need a refill on your cardiac medications before your next appointment, please call your pharmacy*    You have been referred to [facility] AT [place] --CARDIAC REHAB WILL BE THE ONES TO CALL YOU SOON TO ARRANGE YOUR NEW PATIENT ORIENTATION CLASS AND SESSIONS     Follow-Up: At [facility], you and your health needs are our priority.  As part of our continuing mission to provide you with exceptional heart care, our providers are all part of one team.  This team includes your primary Cardiologist (physician) and Advanced Practice Providers or APPs (Physician Assistants and Nurse Practitioners) who all work together to provide you with the care you need, when you need it.  Your next appointment:   6 month(s)  Provider:   Alm Clay, MD     Other Instructions  TESSA CONTE PA-C PRESCRIBED YOU A BLOOD PRESSURE MONITOR TO TAKE TO YOUR PHARMACY OR ANY MEDICAL SUPPLY STORE TO GET  Blood Pressure Record Sheet To take your blood pressure, you will need a blood pressure machine. You can buy a blood pressure machine (blood pressure monitor) at your clinic, drug store, or online. When choosing one, consider: An automatic monitor that has an arm cuff. A cuff that wraps snugly around your upper arm. You should be able to fit only one finger between your arm and the cuff. A device that stores blood pressure reading results. Do not choose a monitor that measures your blood pressure from your wrist or finger. Follow your health care provider's instructions for how to take your blood pressure. To use this form: Take your blood pressure medications every day These measurements should be taken when you have been at rest for at least 10-15 min Take at least 2 readings with each blood pressure check.  This makes sure the results are correct. Wait 1-2 minutes between measurements. Write down the results in the spaces on this form. Keep in mind it should always be recorded systolic over diastolic. Both numbers are important.  Repeat this every day for 2-3 weeks, or as told by your health care provider.  Make a follow-up appointment with your health care provider to discuss the results.  Blood Pressure Log Date Medications taken? (Y/N) Blood Pressure Time of Day

## 2023-08-22 ENCOUNTER — Other Ambulatory Visit (HOSPITAL_COMMUNITY): Payer: Self-pay

## 2023-08-26 ENCOUNTER — Encounter

## 2023-08-26 ENCOUNTER — Other Ambulatory Visit (HOSPITAL_COMMUNITY): Payer: Self-pay

## 2023-08-26 ENCOUNTER — Telehealth: Payer: Self-pay | Admitting: *Deleted

## 2023-08-26 ENCOUNTER — Other Ambulatory Visit (HOSPITAL_BASED_OUTPATIENT_CLINIC_OR_DEPARTMENT_OTHER): Payer: Self-pay

## 2023-08-26 VITALS — BP 138/65 | HR 60 | Temp 98.1°F | Resp 18 | Wt 204.0 lb

## 2023-08-26 DIAGNOSIS — Z006 Encounter for examination for normal comparison and control in clinical research program: Secondary | ICD-10-CM

## 2023-08-26 MED ORDER — SPIRONOLACTONE 25 MG PO TABS
12.5000 mg | ORAL_TABLET | Freq: Every day | ORAL | 3 refills | Status: AC
  Filled 2023-08-26 (×2): qty 45, 90d supply, fill #0
  Filled 2023-11-21: qty 45, 90d supply, fill #1

## 2023-08-26 MED ORDER — STUDY - LIBREXIA-ACS - MILVEXIAN 25 MG OR PLACEBO TABLET (PI-STUCKEY): 1.0000 | ORAL_TABLET | Freq: Two times a day (BID) | ORAL | 0 refills | Status: DC

## 2023-08-26 NOTE — Telephone Encounter (Signed)
 Received message patient needs spironolactone  refilled by  Research Nurse.   Prescription sent to pharmacy as requested

## 2023-08-26 NOTE — Research (Addendum)
 LIBREXIA ACS 39 WEEK VISIT    Medication Kit Accountability Dispensed Status [x] Dispensed [] Dispensed In Error [] Not Dispensed If 'Not Dispensed', provide reason: [] Medical Decision [] Medication Kit not available or damaged [] Study medication discontinued [] Visit skipped or administration skipped [] Medication kit dispensed in error Date Dispensed: 19/Aug/2025  Amount Dispensed:  Medication Number Dispensed:  Medication Number: [id number] Date: 91807974  Medication Number: [id number] Date: 91807974  Medication Number: [id number] Date: 91807974  Medication Number: [id number] Date: 91807974  Return Status: [] Damaged/Returned by subject [] Missing/Not returned by subject [x] Returned by subject If not returned reasons: [] Forgot [] Lost Amount Returned:   Medication Number [id number] Date 26-Aug-2023 Amount Returned 0 Tablets Medication Number [id number] Date 26-Aug-2023 Amount Returned 21 Tablets Medication Number [id number] Date 26-Aug-2023 Amount Returned 0 Tablets Medication Number [id number]           Date 26-Aug-2023 Amount Returned 70 Tablets   Compliance%: 96   Were any suspected endpoint events or adverse events experienced? [x] Yes [] No Pt endorses acid reflux/belching after meals ongoing since 19/January/2025. Denies N/V, diarrhea, signs/symptoms of bleeding. Has not tried any OTC meds for this. Will continue to monitor.     Current Outpatient Medications:    aspirin  EC 81 MG tablet, Take 81 mg by mouth daily. Swallow whole., Disp: , Rfl:    atorvastatin  (LIPITOR) 80 MG tablet, Take 1 tablet (80 mg total) by mouth daily., Disp: 90 tablet, Rfl: 3   benzonatate  (TESSALON ) 200 MG capsule, Take 1 capsule (200 mg total) by mouth 3 (three) times daily as needed for cough., Disp: 21 capsule, Rfl: 0   Blood Pressure Monitoring (BLOOD PRESSURE CUFF) MISC, Please take BP once a day an hour after morning medication., Disp: 1 each, Rfl: 0   Blood Pressure Monitoring (OMRON 3 SERIES BP MONITOR)  DEVI, Use to monitor blood pressure, Disp: 1 each, Rfl: 0   clopidogrel  (PLAVIX ) 75 MG tablet, Take 1 tablet (75 mg total) by mouth daily., Disp: 90 tablet, Rfl: 3   dapagliflozin  propanediol (FARXIGA ) 10 MG TABS tablet, Take 1 tablet (10 mg total) by mouth daily., Disp: 90 tablet, Rfl: 3   irbesartan  (AVAPRO ) 75 MG tablet, Take 1/2 tablet (37.5 mg total) by mouth daily., Disp: 45 tablet, Rfl: 1   metoprolol  succinate (TOPROL  XL) 25 MG 24 hr tablet, Take 1 tablet (25 mg total) by mouth daily., Disp: 90 tablet, Rfl: 3   nitroGLYCERIN  (NITROSTAT ) 0.4 MG SL tablet, Place 1 tablet (0.4 mg total) under the tongue every 5 (five) minutes as needed., Disp: 25 tablet, Rfl: 2   spironolactone  (ALDACTONE ) 25 MG tablet, Take 1/2 tablet (12.5 mg total) by mouth daily., Disp: 45 tablet, Rfl: 3   Study - LIBREXIA-ACS - milvexian 25 mg or placebo tablet (PI-Stuckey), Take 1 tablet by mouth 2 (two) times daily. For investigational use. Take with or without food two times daily. Bring bottle back to every visit., Disp: 280 tablet, Rfl: 0   albuterol  (VENTOLIN  HFA) 108 (90 Base) MCG/ACT inhaler, Inhale 2 puffs into the lungs every 4 (four) hours as needed for wheezing or shortness of breath. (Patient not taking: Reported on 08/26/2023), Disp: 18 g, Rfl: 0   cyclobenzaprine  (FLEXERIL ) 10 MG tablet, Take 1 tablet (10 mg total) by mouth 2 (two) times daily as needed for muscle spasms. (Patient not taking: Reported on 08/26/2023), Disp: 20 tablet, Rfl: 0   ipratropium (ATROVENT ) 0.03 % nasal spray, Place 1 spray into both nostrils 2 (two) times daily as needed for rhinitis. (Patient not taking: Reported on 08/26/2023),  Disp: 30 mL, Rfl: 12

## 2023-09-11 ENCOUNTER — Telehealth (INDEPENDENT_AMBULATORY_CARE_PROVIDER_SITE_OTHER): Admitting: Cardiology

## 2023-09-11 ENCOUNTER — Other Ambulatory Visit: Payer: Self-pay | Admitting: *Deleted

## 2023-09-11 ENCOUNTER — Other Ambulatory Visit (HOSPITAL_COMMUNITY): Payer: Self-pay

## 2023-09-11 ENCOUNTER — Telehealth: Payer: Self-pay | Admitting: *Deleted

## 2023-09-11 ENCOUNTER — Encounter

## 2023-09-11 VITALS — BP 124/83 | HR 58

## 2023-09-11 DIAGNOSIS — I2510 Atherosclerotic heart disease of native coronary artery without angina pectoris: Secondary | ICD-10-CM

## 2023-09-11 DIAGNOSIS — Z006 Encounter for examination for normal comparison and control in clinical research program: Secondary | ICD-10-CM

## 2023-09-11 DIAGNOSIS — R5383 Other fatigue: Secondary | ICD-10-CM | POA: Insufficient documentation

## 2023-09-11 DIAGNOSIS — E119 Type 2 diabetes mellitus without complications: Secondary | ICD-10-CM

## 2023-09-11 DIAGNOSIS — I5022 Chronic systolic (congestive) heart failure: Secondary | ICD-10-CM

## 2023-09-11 DIAGNOSIS — I255 Ischemic cardiomyopathy: Secondary | ICD-10-CM

## 2023-09-11 DIAGNOSIS — Z955 Presence of coronary angioplasty implant and graft: Secondary | ICD-10-CM | POA: Insufficient documentation

## 2023-09-11 DIAGNOSIS — I252 Old myocardial infarction: Secondary | ICD-10-CM

## 2023-09-11 DIAGNOSIS — K219 Gastro-esophageal reflux disease without esophagitis: Secondary | ICD-10-CM | POA: Insufficient documentation

## 2023-09-11 HISTORY — DX: Atherosclerotic heart disease of native coronary artery without angina pectoris: I25.10

## 2023-09-11 LAB — LIPID PANEL

## 2023-09-11 MED ORDER — METOPROLOL SUCCINATE ER 25 MG PO TB24
12.5000 mg | ORAL_TABLET | Freq: Every day | ORAL | 3 refills | Status: AC
  Filled 2023-09-11: qty 45, 90d supply, fill #0
  Filled 2023-10-27: qty 30, 60d supply, fill #0
  Filled 2023-12-29 (×2): qty 30, 60d supply, fill #1

## 2023-09-11 MED ORDER — PANTOPRAZOLE SODIUM 40 MG PO TBEC
40.0000 mg | DELAYED_RELEASE_TABLET | Freq: Every day | ORAL | 3 refills | Status: AC
  Filled 2023-09-11: qty 90, 90d supply, fill #0
  Filled 2023-12-12: qty 90, 90d supply, fill #1

## 2023-09-11 NOTE — Progress Notes (Signed)
 I had Wayne Huff come in for an unscheduled visit today to speak with him in detail.  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  His cardiac status has been stable.  However, he has seen two APPs, not been seen back by a cardiologist, and continues to have untreated DM.   He used to have a primary care MD at [facility] clinic, but desired a male physician and chose not to go back.  During his hospitalization, he was placed on DAPA, and also spironolactone .  He has not had labs since December.  His Hgb has been stable as have been his LFTs.  He has had some symptoms of reflux, not exertional CP.  He drinks PEPSI and MOUNTAIN DEW everyday.    He has a BP log and BPs have been under control.   He has a need for long term primary care, and I called the [facility] clinic and spoke with Shawnee Breeding.  She kindly told me they would reach out to him soon and hopefully schedule a visit hopefully next week.  In the meantime, we were able to get Dr. Anner, who did his primary intervention to come down to clnic, see him, and order the appropriate labs.  I stressed to him the need to keep appointments and address these issues, or potentially face consequences from them.     Encouraged to withdraw from caffeinated beverages  Try to withhold PEPSI and [facility]  2 weeks OTC Prilosec (on clopidogrel  but also on ASA and out >6 months.  Labs per Dr. Anner  FU in [facility]  Continue in the [place] study  Debby BIRCH. Morris, MD Medical Director, [facility]

## 2023-09-11 NOTE — Research (Addendum)
 Librexia ACS Unscheduled Visit    Dr Morris and Dr Anner spoke with the patient.  follow-up appt scheduled with Dr Anner and will have follow-up labs drawn today at his office.  Patient instructed to decrease Metoprolol  to 12 .5 MG every day and add Protonix  40 Mg every day.

## 2023-09-11 NOTE — Telephone Encounter (Addendum)
 Received call from Dr Morris Requesting new pt appt  Pt with elevated glucose and needs a pcp for continuity of care Pt will also need to have other health maintenance items updated.  Will have Practice Administrator reach out to pt and schedule an appt.   New pt appt approved  CMA contacted pt with appt scheduled for 09/18 at 1515. Pt has MyChart and aware and was given address to office [address].

## 2023-09-11 NOTE — Telephone Encounter (Signed)
   Brief Telephone Encounter Note  Wayne Huff is a 53 year old gentleman with history of single-vessel CAD who presented with inferior STEMI on November 23, 2022 and underwent DES PCI to the RCA with staged PCI of the OM 2 2 days later.  Initially EF on echo was 45 to 50% but improved to 60-65% as of February 2025.  He was most recently seen in clinic by Wayne Fabry, PA in July 2025.  He was noting some issues with sleep but also fatigue.  Still working as a Biomedical scientist.  No chest pain or dyspnea just fatigue.  He also has been on Farxiga , spironolactone , ARB and beta-blocker as well as statin has not had labs checked since his hospitalization.   He was enrolled in the [facility] Trial and has been followed up in the research center and I was contacted by the research team with Dr. Morris and Wayne Huff RRT just informing that he has not yet been seen by me in clinic.  I talked with him briefly and Dr. Morris indicated that he was having some GERD symptoms and fatigue.  He also has not had labs checked.  Patient Active Problem List   Diagnosis Date Noted   ST elevation myocardial infarction (STEMI) of inferior wall (HCC) 11/23/2022    Priority: High   Coronary artery disease involving native coronary artery without angina pectoris 09/11/2023   Presence of drug coated stent in right coronary artery 09/11/2023   GERD (gastroesophageal reflux disease) 09/11/2023   Fatigue due to treatment 09/11/2023   Hyperlipidemia with target low density lipoprotein (LDL) cholesterol less than 55 mg/dL 88/80/7975   Acute heart failure with mildly reduced ejection fraction (HFmrEF, 41-49%) (HCC) - Resolved 11/26/2022   Hyperglycemia    Plan: Continue ASA/clopidogrel  Will reduce Toprol  to 1/2 tablet (12.5 mg daily and continue Avapro  37.5 mg daily and spironolactone  12.5 mg daily Will add PPI-Protonix  40 mg daily Will order FLP, CMP, A1c and TSH He will be scheduled to see me in follow-up on September 22  at 10 AM     Alm Clay, MD

## 2023-09-12 LAB — HEMOGLOBIN A1C
Est. average glucose Bld gHb Est-mCnc: 151 mg/dL
Hgb A1c MFr Bld: 6.9 % — ABNORMAL HIGH (ref 4.8–5.6)

## 2023-09-12 LAB — LIPID PANEL
Cholesterol, Total: 127 mg/dL (ref 100–199)
HDL: 40 mg/dL (ref >39)
LDL CALC COMMENT:: 3.2 ratio (ref 0.0–5.0)
LDL Chol Calc (NIH): 71 mg/dL (ref 0–99)
Triglycerides: 84 mg/dL (ref 0–149)
VLDL Cholesterol Cal: 16 mg/dL (ref 5–40)

## 2023-09-12 LAB — COMPREHENSIVE METABOLIC PANEL WITH GFR
ALT: 47 IU/L — AB (ref 0–44)
AST: 29 IU/L (ref 0–40)
Albumin: 4.4 g/dL (ref 3.8–4.9)
Alkaline Phosphatase: 89 IU/L (ref 44–121)
BUN/Creatinine Ratio: 9 (ref 9–20)
BUN: 11 mg/dL (ref 6–24)
Bilirubin Total: 0.5 mg/dL (ref 0.0–1.2)
CO2: 23 mmol/L (ref 20–29)
Calcium: 9.1 mg/dL (ref 8.7–10.2)
Chloride: 98 mmol/L (ref 96–106)
Creatinine, Ser: 1.18 mg/dL (ref 0.76–1.27)
Globulin, Total: 2.5 g/dL (ref 1.5–4.5)
Glucose: 87 mg/dL (ref 70–99)
Potassium: 4.7 mmol/L (ref 3.5–5.2)
Sodium: 136 mmol/L (ref 134–144)
Total Protein: 6.9 g/dL (ref 6.0–8.5)
eGFR: 74 mL/min/1.73 (ref >59)

## 2023-09-12 LAB — TSH: TSH: 2.77 u[IU]/mL (ref 0.450–4.500)

## 2023-09-17 ENCOUNTER — Other Ambulatory Visit: Payer: Self-pay

## 2023-09-25 ENCOUNTER — Other Ambulatory Visit (HOSPITAL_COMMUNITY): Payer: Self-pay

## 2023-09-25 ENCOUNTER — Ambulatory Visit

## 2023-09-25 ENCOUNTER — Other Ambulatory Visit: Payer: Self-pay

## 2023-09-25 ENCOUNTER — Telehealth: Payer: Self-pay

## 2023-09-25 VITALS — BP 131/85 | HR 64 | Temp 97.7°F | Ht 67.0 in | Wt 203.4 lb

## 2023-09-25 DIAGNOSIS — G894 Chronic pain syndrome: Secondary | ICD-10-CM

## 2023-09-25 DIAGNOSIS — Z87891 Personal history of nicotine dependence: Secondary | ICD-10-CM | POA: Diagnosis not present

## 2023-09-25 DIAGNOSIS — Z1211 Encounter for screening for malignant neoplasm of colon: Secondary | ICD-10-CM

## 2023-09-25 DIAGNOSIS — Z23 Encounter for immunization: Secondary | ICD-10-CM | POA: Diagnosis not present

## 2023-09-25 DIAGNOSIS — Z87828 Personal history of other (healed) physical injury and trauma: Secondary | ICD-10-CM | POA: Diagnosis not present

## 2023-09-25 DIAGNOSIS — E1165 Type 2 diabetes mellitus with hyperglycemia: Secondary | ICD-10-CM | POA: Diagnosis present

## 2023-09-25 DIAGNOSIS — Z7985 Long-term (current) use of injectable non-insulin antidiabetic drugs: Secondary | ICD-10-CM

## 2023-09-25 DIAGNOSIS — R5382 Chronic fatigue, unspecified: Secondary | ICD-10-CM

## 2023-09-25 MED ORDER — SEMAGLUTIDE(0.25 OR 0.5MG/DOS) 2 MG/3ML ~~LOC~~ SOPN
PEN_INJECTOR | SUBCUTANEOUS | 0 refills | Status: AC
  Filled 2023-09-25: qty 3, 28d supply, fill #0

## 2023-09-25 MED ORDER — GABAPENTIN 300 MG PO CAPS
300.0000 mg | ORAL_CAPSULE | Freq: Every evening | ORAL | 2 refills | Status: DC
  Filled 2023-09-25: qty 30, 30d supply, fill #0
  Filled 2023-10-27: qty 30, 30d supply, fill #1
  Filled 2023-11-28: qty 30, 30d supply, fill #2

## 2023-09-25 NOTE — Telephone Encounter (Signed)
 Prior Authorization for patient (Ozempic  (0.25 or 0.5 MG/DOSE) 2MG /3ML pen-injectors) came through on cover my meds was submitted awaiting approval or denial.  XZB:[id number]

## 2023-09-25 NOTE — Telephone Encounter (Signed)
 Name: Demontay Grantham MID: [id number] N Decision Date: 09/25/2023 ROSINE SOCKS FOR:  Requested Requested Service Description Code 1 Code 2 Plan Dates Amount Ozempic  Inj 2mg /71ml Medicaid 09/25/2023 WE DENIED: Denied Denied Code 1 Code 2 Plan Service Description Dates Amount Ozempic  Inj 2mg /26ml Medicaid 09/25/2023 We denied your request for:  Ozempic  Inj 2mg /57ml  Medical Necessity Your provider did not send us  information we requested.  On 09/25/2023, we asked your provider for the following important facts or documents: Per your health plan's criteria, this drug is covered if you meet the following: One of the following: (A) You have tried and failed or did not respond fully to metformin containing drug. (B) You cannot use metformin. (C) You have a type of heart or kidney disease (atherosclerotic cardiovascular disease or chronic kidney disease). The information provided does not show that you meet the criteria listed above. Please speak with your doctor about your choices. This decision was made per the [facility]  GLP-1 Receptor Agonists and Combinations Guideline.

## 2023-09-25 NOTE — Patient Instructions (Addendum)
 It was wonderful seeing you today!   We talked about.SABRASABRA  1) Contact [facility] to have them send you or our clinic the colonoscopy records  2) For your back pain: START taking gabapentin  one tablet at bedtime. Someone from physical therapy will call you to schedule an evaluation.   3) For your diabetes: START injecting .25 mg semaglutide  once weekly for 4 weeks. Then increase the dose to .5 mg once weekly for the following two months.   4) We will plan to see you in about 7 weeks to see how things are going.   5) Continue eating well and incorporate activity into your daily life.   Welcome to our clinic and thank you for getting your flu shot.   If you have any questions please feel free to the call the clinic at anytime at [phone].  Have a blessed day,  Dr. Charmayne
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
## 2023-09-27 NOTE — Progress Notes (Signed)
 Internal Medicine Clinic Attending  I was physically present during the key portions of the resident provided service and participated in the medical decision making of patient's management care. I reviewed pertinent patient test results.  The assessment, diagnosis, and plan were formulated together and I agree with the documentation in the resident's note.  Jeanelle Layman CROME, MD

## 2023-09-29 ENCOUNTER — Other Ambulatory Visit (HOSPITAL_COMMUNITY): Payer: Self-pay

## 2023-09-29 ENCOUNTER — Encounter: Payer: Self-pay | Admitting: Cardiology

## 2023-09-29 ENCOUNTER — Ambulatory Visit: Attending: Cardiology | Admitting: Cardiology

## 2023-09-29 VITALS — BP 124/74 | HR 59 | Ht 67.0 in | Wt 205.8 lb

## 2023-09-29 DIAGNOSIS — Z955 Presence of coronary angioplasty implant and graft: Secondary | ICD-10-CM | POA: Diagnosis present

## 2023-09-29 DIAGNOSIS — I2510 Atherosclerotic heart disease of native coronary artery without angina pectoris: Secondary | ICD-10-CM | POA: Insufficient documentation

## 2023-09-29 DIAGNOSIS — R5383 Other fatigue: Secondary | ICD-10-CM

## 2023-09-29 DIAGNOSIS — E119 Type 2 diabetes mellitus without complications: Secondary | ICD-10-CM | POA: Diagnosis present

## 2023-09-29 DIAGNOSIS — I10 Essential (primary) hypertension: Secondary | ICD-10-CM

## 2023-09-29 DIAGNOSIS — I2119 ST elevation (STEMI) myocardial infarction involving other coronary artery of inferior wall: Secondary | ICD-10-CM

## 2023-09-29 DIAGNOSIS — Z8679 Personal history of other diseases of the circulatory system: Secondary | ICD-10-CM

## 2023-09-29 DIAGNOSIS — E785 Hyperlipidemia, unspecified: Secondary | ICD-10-CM | POA: Diagnosis not present

## 2023-09-29 MED ORDER — EZETIMIBE 10 MG PO TABS
10.0000 mg | ORAL_TABLET | Freq: Every day | ORAL | 3 refills | Status: AC
  Filled 2023-09-29: qty 30, 30d supply, fill #0
  Filled 2023-10-27: qty 30, 30d supply, fill #1
  Filled 2023-11-28: qty 90, 90d supply, fill #2

## 2023-09-29 NOTE — Assessment & Plan Note (Addendum)
 Overall recovered cardiomyopathy.  Not able to tolerate aggressive GDMT.  Euvolemic on exam with no diuretic requirement Blood pressures have been stable on current regimen with no dizziness and no CHF symptoms Energy level and heart rate better on lower dose Toprol  -Currently taking Toprol -XL and spironolactone  12.5 mg daily (1/2 of the 25 mg tablets of both), and 37.5 mg daily Avapro  (1/2 of 75 mg tablet) - Monitor blood pressures, and if they tend to be in the 130 range systolic, would titrate Avapro  to full dose - Continue Farxiga  10 mL daily for combination of diabetes and cardiomyopathy - He has not started, but has ordered semaglutide -I recommend that he does follow through with taking it if he can figure out the cost issues

## 2023-09-29 NOTE — Assessment & Plan Note (Signed)
 Currently on DAPT with ASA-Plavix -the 1 year point would be essentially the end of November 2025. - As of December 08, 2023, stop aspirin  - As of December 08, 2023, will be on Plavix  75 mg daily as SAPT => would be okay to hold 5 to 7 days for procedures or surgeries.

## 2023-09-29 NOTE — Assessment & Plan Note (Signed)
 Hyperlipidemia managed with Lipitor. Plan to add Zetia  to improve cholesterol levels. - Add Zetia  10 mg to current Lipitor regimen. - Check lipid panel in November. - Recheck cholesterol in December.

## 2023-09-29 NOTE — Assessment & Plan Note (Addendum)
 Hypertension managed with Avapro /Toprol  and spironolactone  taking 1/2 tablet of each.. - Continue Avapro  37.5 mg daily along with 12.5 mg daily of Toprol  and spironolactone .  - Low threshold to titrate Avapro  to 75 mg (full tablet) if systolic pressures are stable above 120 to 130 mmHg.

## 2023-09-29 NOTE — Progress Notes (Signed)
 Cardiology Office Note:  .   Date:  09/29/2023  ID:  Wayne Huff, DOB [birth year], MRN [id number] PCP: Charmayne Holmes, DO  [facility] Providers Cardiologist:  Alm Clay, MD     Chief Complaint  Patient presents with   Follow-up    First MD visit since MI.  Doing well.   Coronary Artery Disease    No angina.    Patient Profile: .     Wayne Huff is a 53 y.o. male with a PMH notable for single-vessel CAD (inferior STEMI Nov 2024-RCA PCI and staged OM2 PCI; EF improved from 45 to 50% up to 60 to 65% on follow-up Echo), with CRF's of HTN, HLD who presents here for 50-month follow-up at the request of No ref. provider found.  Wayne Huff is a 53 year old gentleman with history of single-vessel CAD who presented with inferior STEMI on November 23, 2022 and underwent DES PCI to the RCA with staged PCI of the OM 2 2 days later.  Initially EF on echo was 45 to 50% but improved to 60-65% as of February 2025.   He was most recently seen in clinic by Orren Fabry, PA in July 2025.  He was noting some issues with sleep but also fatigue.  Still working as a Biomedical scientist.  No chest pain or dyspnea just fatigue.  He also has been on Farxiga , spironolactone , ARB and beta-blocker as well as statin has not had labs checked since his hospitalization.     He was enrolled in the [facility] Trial and has been followed up in the research center and I was contacted by the research team with Dr. Morris and Greig Lay RRT just informing that he has not yet been seen by me in clinic.   I talked with him briefly and Dr. Morris indicated that he was having some GERD symptoms and fatigue.  He also has not had labs checked. Plan: Continue ASA/clopidogrel  Will reduce Toprol  to 1/2 tablet (12.5 mg daily and continue Avapro  37.5 mg daily and spironolactone  12.5 mg daily Will add PPI-Protonix  40 mg daily Will order FLP, CMP, A1c and TSH He will be scheduled to see me in follow-up on September 22 at 10  AM     Wayne Huff was last seen on July 30, 2023 by Orren Fabry, PA for follow-up.  He denied any active symptoms.  Having some issues with poor sleep and then fatigue.  BP was borderline, recommended Omron BP cuff.  He actually brought his BP log with him today.  Subjective  Discussed the use of AI scribe software for clinical note transcription with the patient, who gave verbal consent to proceed.  History of Present Illness Wayne Huff is a 53 year old male with hypertension, hyperlipidemia, and coronary artery disease who presents for follow-up after a STEMI and PCI.  He experienced a STEMI in November 2024, which was treated with PCI to the RCA and staged PCI to OM2. Since the procedure, he has not experienced any chest pain or pressure.  No significant shortness of breath, orthopnea, or paroxysmal nocturnal dyspnea. He mentions occasional balance issues but no dizziness or syncope. No stroke symptoms or bleeding issues, although he notes some bruising.  BP log shows most of his pressures ranging from 110s to 130s over 70s with a handful of high 140s over 80s and only reading of 150/80s.  There was also several readings in the 100-110/70s indicating pretty out of control.  No longer  really noticing the fatigue issues.  His current medications include Toprol  12.5 mg daily, spironolactone  12.5 mg and Avapro  37.5 mg daily.   Cardiovascular ROS: no chest pain or dyspnea on exertion positive for - more than usual bruising, occasional dizziness and balance issues but no actual falls or syncope. negative for - edema, irregular heartbeat, orthopnea, palpitations, paroxysmal nocturnal dyspnea, rapid heart rate, shortness of breath, or syncope or near syncope, TIA or amaurosis fugax, claudication.  Melena, hematochezia and hematuria or epistaxis.  ROS:  Review of Systems - Negative except symptoms noted above    Objective   CV Medications: Toprol  XL 12.5 mg daily, spironolactone  12.5  mg daily and irbesartan  37.5 mg; atorvastatin  80 mg daily, ASA 81 mg and Plavix  75 mg daily; Farxiga  10 mg daily.  He has a prescription for semaglutide  0.25 mg weekly but has not yet started.  Studies Reviewed: SABRA   EKG Interpretation Date/Time:  Monday September 29 2023 10:34:20 EDT Ventricular Rate:  59 PR Interval:  124 QRS Duration:  86 QT Interval:  412 QTC Calculation: 407 R Axis:   70  Text Interpretation: Sinus bradycardia When compared with ECG of 03-Dec-2022 13:51, Minimal criteria for Inferior infarct are no longer Present T wave inversion no longer evident in Inferior leads Nonspecific T wave abnormality now evident in Anterior leads Confirmed by Anner Lenis ([id number]) on 09/29/2023 11:13:52 AM    Lab Results  Component Value Date   CHOL 127 09/11/2023   HDL 40 09/11/2023   LDLCALC 71 09/11/2023   TRIG 84 09/11/2023   CHOLHDL 3.2 09/11/2023   Lab Results  Component Value Date   NA 136 09/11/2023   CL 98 09/11/2023   K 4.7 09/11/2023   CO2 23 09/11/2023   BUN 11 09/11/2023   CREATININE 1.18 09/11/2023   EGFR 74 09/11/2023   CALCIUM  9.1 09/11/2023   ALBUMIN 4.4 09/11/2023   GLUCOSE 87 09/11/2023   Lab Results  Component Value Date   WBC 7.9 12/03/2022   HGB 14.2 12/03/2022   HCT 42.4 12/03/2022   MCV 98 (H) 12/03/2022   PLT 297 12/03/2022   CATH (11/23/2022)-inferior STEMI: Proximal RCA 60%-> 100% followed by proximal to mid RCA 90% (DES PCI covering all 3 lesions with Onyx Frontier 2.75 mm x 30 mm deployed to 2.9 mm) mid to distal RCA 40%; OM2 80% (staged DES PCI on 11/25/2022; proximal to mid LAD 30%.  EF estimated 45 to 50%.  Moderate elevated LVEDP.. Staged PCI of OM 2 11/25/2022: 80% & 60% stenosis (DES PCI Synergy XD 2.25 mm x 28 mm to 42.5 mm covering both lesions). Diagnostic: Dominance: Right      Intervention        Staged Intervention    ECHO 02/27/2023: EF improved to 60-65%.  No RWMA.  AoV calcification with no stenosis.=> EF normalized Echo  11/26/2022: EF 45 to 50% with inferior wall akinesis and hypokinesis of the inferolateral wall as well as basal inferoseptal wall.  AOV sclerosis with no stenosis.  Risk Assessment/Calculations:               Physical Exam:   VS:  BP 124/74 (BP Location: Left Arm, Patient Position: Sitting)   Pulse (!) 59   Ht 5' 7 (1.702 m)   Wt 205 lb 12.8 oz (93.4 kg)   SpO2 96%   BMI 32.23 kg/m    Wt Readings from Last 3 Encounters:  09/29/23 205 lb 12.8 oz (93.4 kg)  09/25/23 203 lb  6.4 oz (92.3 kg)  08/26/23 204 lb (92.5 kg)      GEN: Well nourished, well groomed in no acute distress; healthy appearing but mildly obese NECK: No JVD; No carotid bruits CARDIAC: Normal S1, S2; RRR, no murmurs, rubs, gallops RESPIRATORY:  Clear to auscultation without rales, wheezing or rhonchi ; nonlabored, good air movement. ABDOMEN: Soft, non-tender, non-distended EXTREMITIES:  No edema; No deformity      ASSESSMENT AND PLAN: .    Problem List Items Addressed This Visit       Cardiology Problems   Coronary artery disease involving native coronary artery without angina pectoris - Primary (Chronic)   Status post-STEMI with PCI to RCA and staged PCI to OM2.  The patient is experiencing little balance issues here and there, but no real dizziness or passing out spells.  No stroke symptoms, no bleeding issues, just some bruising. - Discontinue aspirin  starting December 1.  (Marking 1 year post MI); continue Plavix  75 mg monotherapy for additional year.   Okay to hold Plavix  5 to 7 days preop for surgeries or procedures starting December 08, 2023 -Blood pressures to be well-controlled on his home monitor:  Continue current doses of Toprol -XL 12.5 mg daily, irbesartan  37.5 mg daily and spironolactone  12.5 mg daily (all of these are 1/2 tabs of 25 mg, 75 mg and 25 mg respectively) - Lipids not quite adequately controlled on 80 mg atorvastatin , will add Zetia  10 mg daily and reassess lipids. => Targeting  LDL<55 (currently 71) - Follow up with Orren Fabry, PA in mid to late December. - Schedule follow-up appointment in May or June with MD.      Relevant Medications   ezetimibe  (ZETIA ) 10 MG tablet   Other Relevant Orders   EKG 12-Lead (Completed)   Lipid panel   Comprehensive metabolic panel with GFR   Hyperlipidemia with target low density lipoprotein (LDL) cholesterol less than 55 mg/dL (Chronic)   Hyperlipidemia managed with Lipitor. Plan to add Zetia  to improve cholesterol levels. - Add Zetia  10 mg to current Lipitor regimen. - Check lipid panel in November. - Recheck cholesterol in December.      Relevant Medications   ezetimibe  (ZETIA ) 10 MG tablet   Other Relevant Orders   Lipid panel   Comprehensive metabolic panel with GFR   Primary hypertension (Chronic)   Hypertension managed with Avapro /Toprol  and spironolactone  taking 1/2 tablet of each.. - Continue Avapro  37.5 mg daily along with 12.5 mg daily of Toprol  and spironolactone .  - Low threshold to titrate Avapro  to 75 mg (full tablet) if systolic pressures are stable above 120 to 130 mmHg.      Relevant Medications   ezetimibe  (ZETIA ) 10 MG tablet   ST elevation myocardial infarction (STEMI) of inferior wall (HCC) (Chronic)   Just about 10 months out from inferior STEMI doing very well with EF having improved on echo and no further anginal symptoms.  On minimal medications.  He is enrolled in the [facility] trial being followed by the [employer] team.      Relevant Medications   ezetimibe  (ZETIA ) 10 MG tablet     Other   Diabetes mellitus type II, non insulin dependent (HCC) (Chronic)   Type 2 diabetes with potential benefit from semaglutide  for weight loss and improved glycemic control. -Continue Farxiga  10 mg daily - I agree with initiating semaglutide  for weight loss and diabetes management.  (He is still trying to get the payment plan worked out)      Fatigue  due to treatment (Chronic)   Relevant Orders    Lipid panel   Comprehensive metabolic panel with GFR   History of ischemic cardiomyopathy (Chronic)   Overall recovered cardiomyopathy.  Not able to tolerate aggressive GDMT.  Euvolemic on exam with no diuretic requirement Blood pressures have been stable on current regimen with no dizziness and no CHF symptoms Energy level and heart rate better on lower dose Toprol  -Currently taking Toprol -XL and spironolactone  12.5 mg daily (1/2 of the 25 mg tablets of both), and 37.5 mg daily Avapro  (1/2 of 75 mg tablet) - Monitor blood pressures, and if they tend to be in the 130 range systolic, would titrate Avapro  to full dose - Continue Farxiga  10 mL daily for combination of diabetes and cardiomyopathy - He has not started, but has ordered semaglutide -I recommend that he does follow through with taking it if he can figure out the cost issues      Presence of drug coated stent in right coronary artery (Chronic)   Currently on DAPT with ASA-Plavix -the 1 year point would be essentially the end of November 2025. - As of December 08, 2023, stop aspirin  - As of December 08, 2023, will be on Plavix  75 mg daily as SAPT => would be okay to hold 5 to 7 days for procedures or surgeries.           Follow-Up: Return in about 3 months (around 12/29/2023) for 3-4 month follow-up, Follow-up with APP, Alternate 3 months with APP, 8 month with MD.  I spent 67 minutes in the care of Wayne Huff today including reviewing labs (2 minutes reviewing previous and current labs), reviewing studies (I personally reviewed the cardiac catheter films along with echocardiogram reports-7 minutes), face to face time discussing treatment options (31 minutes), reviewing records from previous APP notes and hospitalization as well as research clinic notes (10 minutes), 17 minutes dictating, and documenting in the encounter.      Signed, Alm MICAEL Clay, MD, MS Alm Clay, M.D., M.S. Interventional Cardiologist  [facility] Pager # [phone]

## 2023-09-29 NOTE — Assessment & Plan Note (Signed)
 Status post-STEMI with PCI to RCA and staged PCI to OM2.  The patient is experiencing little balance issues here and there, but no real dizziness or passing out spells.  No stroke symptoms, no bleeding issues, just some bruising. - Discontinue aspirin  starting December 1.  (Marking 1 year post MI); continue Plavix  75 mg monotherapy for additional year.   Okay to hold Plavix  5 to 7 days preop for surgeries or procedures starting December 08, 2023 -Blood pressures to be well-controlled on his home monitor:  Continue current doses of Toprol -XL 12.5 mg daily, irbesartan  37.5 mg daily and spironolactone  12.5 mg daily (all of these are 1/2 tabs of 25 mg, 75 mg and 25 mg respectively) - Lipids not quite adequately controlled on 80 mg atorvastatin , will add Zetia  10 mg daily and reassess lipids. => Targeting LDL<55 (currently 71) - Follow up with Orren Fabry, PA in mid to late December. - Schedule follow-up appointment in May or June with MD.

## 2023-09-29 NOTE — Assessment & Plan Note (Signed)
 Type 2 diabetes with potential benefit from semaglutide  for weight loss and improved glycemic control. -Continue Farxiga  10 mg daily - I agree with initiating semaglutide  for weight loss and diabetes management.  (He is still trying to get the payment plan worked out)

## 2023-09-29 NOTE — Assessment & Plan Note (Addendum)
 Just about 10 months out from inferior STEMI doing very well with EF having improved on echo and no further anginal symptoms.  On minimal medications.  He is enrolled in the [facility] trial being followed by the [employer] team.

## 2023-09-29 NOTE — Patient Instructions (Signed)
 Medication Instructions:    Start Zetia  10 mg daily    May stop taking Aspirin  81 mg on Dec 1 , 2025 ( last dose)    *If you need a refill on your cardiac medications before your next appointment, please call your pharmacy*   [facility] [place] before your appt in Dec 2025  CMP LIPID  If you have labs (blood work) drawn today and your tests are completely normal, you will receive your results only by: MyChart Message (if you have MyChart) OR A paper copy in the mail If you have any lab test that is abnormal or we need to change your treatment, we will call you to review the results.   Testing/Procedures: Not needed   Follow-Up: At [facility], you and your health needs are our priority.  As part of our continuing mission to provide you with exceptional heart care, we have created designated Provider Care Teams.  These Care Teams include your primary Cardiologist (physician) and Advanced Practice Providers (APPs -  Physician Assistants and Nurse Practitioners) who all work together to provide you with the care you need, when you need it.     Your next appointment:   3 month(s)  The format for your next appointment:   In Person  Provider:   Orren Fabry, PA-C      Then, Alm Clay, MD will plan to see you again in 5 to  6 month(s).   Other Instructions
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
## 2023-10-02 ENCOUNTER — Ambulatory Visit: Attending: Family Medicine

## 2023-10-02 ENCOUNTER — Other Ambulatory Visit: Payer: Self-pay

## 2023-10-02 DIAGNOSIS — R262 Difficulty in walking, not elsewhere classified: Secondary | ICD-10-CM | POA: Insufficient documentation

## 2023-10-02 DIAGNOSIS — G894 Chronic pain syndrome: Secondary | ICD-10-CM | POA: Insufficient documentation

## 2023-10-02 DIAGNOSIS — M79604 Pain in right leg: Secondary | ICD-10-CM | POA: Diagnosis present

## 2023-10-02 DIAGNOSIS — M5459 Other low back pain: Secondary | ICD-10-CM | POA: Insufficient documentation

## 2023-10-02 DIAGNOSIS — M79605 Pain in left leg: Secondary | ICD-10-CM | POA: Diagnosis present

## 2023-10-02 DIAGNOSIS — M6281 Muscle weakness (generalized): Secondary | ICD-10-CM | POA: Insufficient documentation

## 2023-10-03 ENCOUNTER — Ambulatory Visit: Payer: Self-pay

## 2023-10-06 ENCOUNTER — Encounter: Payer: Self-pay | Admitting: Physical Therapy

## 2023-10-06 ENCOUNTER — Ambulatory Visit: Admitting: Physical Therapy

## 2023-10-06 DIAGNOSIS — M6281 Muscle weakness (generalized): Secondary | ICD-10-CM

## 2023-10-06 DIAGNOSIS — M5459 Other low back pain: Secondary | ICD-10-CM | POA: Diagnosis not present
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
## 2023-10-15 ENCOUNTER — Ambulatory Visit: Attending: Family Medicine

## 2023-10-15 DIAGNOSIS — R262 Difficulty in walking, not elsewhere classified: Secondary | ICD-10-CM | POA: Insufficient documentation

## 2023-10-15 DIAGNOSIS — M6281 Muscle weakness (generalized): Secondary | ICD-10-CM | POA: Insufficient documentation

## 2023-10-15 DIAGNOSIS — M5459 Other low back pain: Secondary | ICD-10-CM | POA: Insufficient documentation

## 2023-10-15 DIAGNOSIS — M79604 Pain in right leg: Secondary | ICD-10-CM | POA: Insufficient documentation

## 2023-10-15 DIAGNOSIS — M79605 Pain in left leg: Secondary | ICD-10-CM | POA: Diagnosis present

## 2023-10-20 ENCOUNTER — Other Ambulatory Visit (HOSPITAL_COMMUNITY): Payer: Self-pay

## 2023-10-22 ENCOUNTER — Ambulatory Visit: Admitting: Physical Therapy

## 2023-10-24 ENCOUNTER — Ambulatory Visit
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
## 2023-10-27 ENCOUNTER — Other Ambulatory Visit (HOSPITAL_COMMUNITY): Payer: Self-pay

## 2023-10-29 ENCOUNTER — Ambulatory Visit: Admitting: Physical Therapy

## 2023-10-30 ENCOUNTER — Ambulatory Visit: Admitting: Physical Therapy
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
## 2023-11-07 ENCOUNTER — Ambulatory Visit

## 2023-11-07 DIAGNOSIS — R262 Difficulty in walking, not elsewhere classified: Secondary | ICD-10-CM

## 2023-11-07 DIAGNOSIS — M79604 Pain in right leg: Secondary | ICD-10-CM

## 2023-11-07 DIAGNOSIS — M5459 Other low back pain: Secondary | ICD-10-CM | POA: Diagnosis not present

## 2023-11-07 DIAGNOSIS — M79605 Pain in left leg: Secondary | ICD-10-CM

## 2023-11-07 DIAGNOSIS — M6281 Muscle weakness (generalized): Secondary | ICD-10-CM

## 2023-11-10 ENCOUNTER — Encounter: Payer: Self-pay | Admitting: Radiology

## 2023-11-13 ENCOUNTER — Ambulatory Visit: Payer: Self-pay | Admitting: Student

## 2023-11-13 VITALS — BP 116/69 | HR 67 | Temp 97.6°F | Ht 67.0 in | Wt 207.2 lb

## 2023-11-13 DIAGNOSIS — Z2821 Immunization not carried out because of patient refusal: Secondary | ICD-10-CM | POA: Diagnosis not present

## 2023-11-13 DIAGNOSIS — E119 Type 2 diabetes mellitus without complications: Secondary | ICD-10-CM | POA: Diagnosis present

## 2023-11-13 DIAGNOSIS — Z9189 Other specified personal risk factors, not elsewhere classified: Secondary | ICD-10-CM | POA: Diagnosis not present

## 2023-11-13 NOTE — Assessment & Plan Note (Addendum)
 Orders:    Ambulatory referral to Podiatry

## 2023-11-13 NOTE — Progress Notes (Unsigned)
 Internal Medicine Clinic Attending  Case discussed with the resident at the time of the visit.  We reviewed the resident's history and exam and pertinent patient test results.  I agree with the assessment, diagnosis, and plan of care documented in the resident's note.
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
## 2023-11-13 NOTE — Patient Instructions (Addendum)
 Thank you, [facility] for allowing us  to provide your care today. Today we discussed:  -Will check on Ozempic  and Farxiga  for your diabetes.  -Referral to foot doctor -Referral to check for sleep apnea   Follow up: 1-2 months for diabetes check up   Should you have any questions or concerns please call the internal medicine clinic at [phone].    Circe Chilton, D.O. [facility] Center

## 2023-11-14 ENCOUNTER — Other Ambulatory Visit (HOSPITAL_COMMUNITY): Payer: Self-pay

## 2023-11-14 ENCOUNTER — Telehealth (HOSPITAL_COMMUNITY): Payer: Self-pay | Admitting: Pharmacy Technician

## 2023-11-14 ENCOUNTER — Encounter: Payer: Self-pay | Admitting: Student

## 2023-11-14 ENCOUNTER — Telehealth (HOSPITAL_COMMUNITY): Payer: Self-pay

## 2023-11-14 MED ORDER — DAPAGLIFLOZIN PROPANEDIOL 10 MG PO TABS
10.0000 mg | ORAL_TABLET | Freq: Every day | ORAL | 3 refills | Status: AC
  Filled 2023-11-14 – 2023-11-17 (×2): qty 90, 90d supply, fill #0

## 2023-11-14 NOTE — Telephone Encounter (Signed)
 Pharmacy Patient Advocate Encounter   Received notification from Pt Calls Messages that prior authorization for Farxiga  10MG  tablets  is required/requested.   Insurance verification completed.   The patient is insured through [facility] MEDICAID.   Per test claim: PA required; PA submitted to above mentioned insurance via Latent Key/confirmation #/EOC [facility] Status is pending

## 2023-11-14 NOTE — Telephone Encounter (Signed)
 PA request has been Received. New Encounter has been or will be created for follow up. For additional info see Pharmacy Prior Auth telephone encounter from 11/14/23.

## 2023-11-17 ENCOUNTER — Other Ambulatory Visit (HOSPITAL_COMMUNITY): Payer: Self-pay

## 2023-11-17 NOTE — Telephone Encounter (Signed)
 Pharmacy Patient Advocate Encounter  Received notification from [facility] MEDICAID that Prior Authorization for Farxiga  10MG  tablets  has been APPROVED from 11/14/23 to 11/13/24. Ran test claim, Copay is $4. This test claim was processed through [facility]- copay amounts may vary at other pharmacies due to pharmacy/plan contracts, or as the patient moves through the different stages of their insurance plan.   PA #/Case ID/Reference #: [id number]

## 2023-11-21 ENCOUNTER — Other Ambulatory Visit (HOSPITAL_COMMUNITY): Payer: Self-pay

## 2023-11-21 DIAGNOSIS — Z006 Encounter for examination for normal comparison and control in clinical research program: Secondary | ICD-10-CM

## 2023-11-21 NOTE — Research (Signed)
 Spoke to pt to inform of Librexia sponsor request to stop taking trial drug as study has met GTED. Instructed to bring all trial medicine back with bottles. Pt verbalized understanding and EOT visit has been scheduled.

## 2023-11-25 ENCOUNTER — Encounter

## 2023-11-25 VITALS — BP 146/79 | HR 76

## 2023-11-25 DIAGNOSIS — Z006 Encounter for examination for normal comparison and control in clinical research program: Secondary | ICD-10-CM

## 2023-11-25 NOTE — Research (Signed)
 [facility] END OF TREATMENT     [facility] Hematology/Chemistry drawn: [x] Yes [] No   EQ-5D-5L Assessment Completed? [x] Yes [] No Reason not done: [] Subject Forgot [] Subject too ill [] Subject refused [] Technical failure [] Other If Other, Specify Collection Date: 18/Nov/2025   Medication Kit Accountability Dispensed Status [] Dispensed [] Dispensed In Error [x] Not Dispensed If 'Not Dispensed', provide reason: [] Medical Decision [] Medication Kit not available or damaged [] Study medication discontinued [x]  Study closure  Date Dispensed: Amount Dispensed: Return Status: [] Damaged/Returned by subject [] Missing/Not returned by subject [x] Returned by subject If not returned reasons: [] Forgot [] Lost Date Returned: 18/Nov/2025 Amount Returned:  [phone] tablets [id number] tablets  [phone] 70 tablets  [phone] 37 tablets    Compliance%: 95   Were any suspected endpoint events or adverse events experienced? [] Yes [x] No      Current Outpatient Medications:    atorvastatin  (LIPITOR) 80 MG tablet, Take 1 tablet (80 mg total) by mouth daily., Disp: 90 tablet, Rfl: 3   Blood Pressure Monitoring (BLOOD PRESSURE CUFF) MISC, Please take BP once a day an hour after morning medication., Disp: 1 each, Rfl: 0   Blood Pressure Monitoring (OMRON 3 SERIES BP MONITOR) DEVI, Use to monitor blood pressure, Disp: 1 each, Rfl: 0   clopidogrel  (PLAVIX ) 75 MG tablet, Take 1 tablet (75 mg total) by mouth daily., Disp: 90 tablet, Rfl: 3   dapagliflozin  propanediol (FARXIGA ) 10 MG TABS tablet, Take 1 tablet (10 mg total) by mouth daily., Disp: 90 tablet, Rfl: 3   ezetimibe  (ZETIA ) 10 MG tablet, Take 1 tablet (10 mg total) by mouth daily., Disp: 90 tablet, Rfl: 3   gabapentin  (NEURONTIN ) 300 MG capsule, Take 1 capsule (300 mg total) by mouth at bedtime., Disp: 30 capsule, Rfl: 2   irbesartan  (AVAPRO ) 75 MG tablet, Take 1/2 tablet (37.5 mg total) by mouth daily., Disp: 45 tablet, Rfl: 1   metoprolol   succinate (TOPROL  XL) 25 MG 24 hr tablet, Take 0.5 tablets (12.5 mg total) by mouth daily., Disp: 90 tablet, Rfl: 3   nitroGLYCERIN  (NITROSTAT ) 0.4 MG SL tablet, Place 1 tablet (0.4 mg total) under the tongue every 5 (five) minutes as needed., Disp: 25 tablet, Rfl: 2   pantoprazole  (PROTONIX ) 40 MG tablet, Take 1 tablet (40 mg total) by mouth daily., Disp: 90 tablet, Rfl: 3   spironolactone  (ALDACTONE ) 25 MG tablet, Take 1/2 tablet (12.5 mg total) by mouth daily., Disp: 45 tablet, Rfl: 3   aspirin  EC 81 MG tablet, Take 81 mg by mouth daily. Swallow whole. (Patient not taking: Reported on 11/25/2023), Disp: , Rfl:    Study - [facility]-ACS - milvexian 25 mg or placebo tablet (PI-Stuckey), Take 1 tablet by mouth 2 (two) times daily. For investigational use. Take with or without food two times daily. Bring bottle back to every visit. (Patient not taking: Reported on 11/25/2023), Disp: 280 tablet, Rfl: 0

## 2023-11-27 NOTE — Research (Addendum)
 Are there any labs that are clinically significant?  Yes []  OR No[x]   Trial has been terminated due to futility.  TDS

## 2023-11-28 ENCOUNTER — Ambulatory Visit: Admitting: Podiatry

## 2023-11-28 ENCOUNTER — Other Ambulatory Visit (HOSPITAL_COMMUNITY): Payer: Self-pay

## 2023-11-28 DIAGNOSIS — I999 Unspecified disorder of circulatory system: Secondary | ICD-10-CM | POA: Diagnosis not present

## 2023-11-28 DIAGNOSIS — M79675 Pain in left toe(s): Secondary | ICD-10-CM | POA: Diagnosis not present

## 2023-11-28 DIAGNOSIS — B351 Tinea unguium: Secondary | ICD-10-CM

## 2023-11-28 DIAGNOSIS — M79674 Pain in right toe(s): Secondary | ICD-10-CM

## 2023-11-30 NOTE — Progress Notes (Signed)
 Subjective:   Patient ID: Wayne Huff, male   DOB: 53 y.o.   MRN: [id number]   HPI Patient presents with the possibility for pain in his nailbeds that are thick he cannot cut also has a lot of tingling in his feet and a lot of pain in his lower legs with history of back issues also.  States both legs can bother him at times and he states he no longer smokes is not able to be active   Review of Systems  All other systems reviewed and are negative.       Objective:  Physical Exam Vitals and nursing note reviewed.  Constitutional:      Appearance: He is well-developed.  Pulmonary:     Effort: Pulmonary effort is normal.  Musculoskeletal:        General: Normal range of motion.  Skin:    General: Skin is warm.  Neurological:     Mental Status: He is alert.     Neuro vascular indicates significant diminishment of circulatory status DP PT pulses.  Moderate claudication symptomatology when questioned no open ulcerations were noted or other breaks down in skin tissue.  Patient has thickened nailbeds 1-5 both feet dystrophic moderately painful and complete inability to cut himself.  Does have digital perfusion is well oriented     Assessment:  Appears to be mycotic nail infection bilateral along with possibility for vascular disease with pain     Plan:  H&P all conditions reviewed and he does not have ulcerations but I think vascular studies would be of benefit and he is referred for this to be done.  I did debride nailbeds 1-5 both feet discussed daily inspections reappoint to recheck

## 2023-12-08 DIAGNOSIS — Z006 Encounter for examination for normal comparison and control in clinical research program: Secondary | ICD-10-CM

## 2023-12-08 NOTE — Research (Signed)
 [facility] END OF STUDY PHONE CALL    Con meds: [x] Yes [] No   EQ-5D-5L Assessment Completed? [x] Yes [] No Reason not done: [] Subject Forgot [] Subject too ill [] Subject refused [] Technical failure [] Other If Other, Specify Collection Date: 01/Dec/2025    Were any suspected endpoint events or adverse events experienced? [] Yes [x] No     Current Outpatient Medications:    atorvastatin  (LIPITOR) 80 MG tablet, Take 1 tablet (80 mg total) by mouth daily., Disp: 90 tablet, Rfl: 3   Blood Pressure Monitoring (BLOOD PRESSURE CUFF) MISC, Please take BP once a day an hour after morning medication., Disp: 1 each, Rfl: 0   Blood Pressure Monitoring (OMRON 3 SERIES BP MONITOR) DEVI, Use to monitor blood pressure, Disp: 1 each, Rfl: 0   clopidogrel  (PLAVIX ) 75 MG tablet, Take 1 tablet (75 mg total) by mouth daily., Disp: 90 tablet, Rfl: 3   dapagliflozin  propanediol (FARXIGA ) 10 MG TABS tablet, Take 1 tablet (10 mg total) by mouth daily., Disp: 90 tablet, Rfl: 3   ezetimibe  (ZETIA ) 10 MG tablet, Take 1 tablet (10 mg total) by mouth daily., Disp: 90 tablet, Rfl: 3   gabapentin  (NEURONTIN ) 300 MG capsule, Take 1 capsule (300 mg total) by mouth at bedtime., Disp: 30 capsule, Rfl: 2   irbesartan  (AVAPRO ) 75 MG tablet, Take 1/2 tablet (37.5 mg total) by mouth daily., Disp: 45 tablet, Rfl: 1   metoprolol  succinate (TOPROL  XL) 25 MG 24 hr tablet, Take 0.5 tablets (12.5 mg total) by mouth daily., Disp: 90 tablet, Rfl: 3   nitroGLYCERIN  (NITROSTAT ) 0.4 MG SL tablet, Place 1 tablet (0.4 mg total) under the tongue every 5 (five) minutes as needed., Disp: 25 tablet, Rfl: 2   pantoprazole  (PROTONIX ) 40 MG tablet, Take 1 tablet (40 mg total) by mouth daily., Disp: 90 tablet, Rfl: 3   spironolactone  (ALDACTONE ) 25 MG tablet, Take 1/2 tablet (12.5 mg total) by mouth daily., Disp: 45 tablet, Rfl: 3

## 2023-12-09 ENCOUNTER — Ambulatory Visit (HOSPITAL_COMMUNITY)
Admission: RE | Admit: 2023-12-09 | Discharge: 2023-12-09 | Disposition: A | Source: Ambulatory Visit | Attending: Podiatry

## 2023-12-09 DIAGNOSIS — I999 Unspecified disorder of circulatory system: Secondary | ICD-10-CM | POA: Insufficient documentation

## 2023-12-11 LAB — VAS US ABI WITH/WO TBI
Left ABI: 0.52
Right ABI: 0.54

## 2023-12-12 ENCOUNTER — Other Ambulatory Visit (HOSPITAL_COMMUNITY): Payer: Self-pay

## 2023-12-12 ENCOUNTER — Ambulatory Visit: Payer: Self-pay | Admitting: Podiatry

## 2023-12-12 ENCOUNTER — Other Ambulatory Visit: Payer: Self-pay

## 2023-12-12 DIAGNOSIS — R6889 Other general symptoms and signs: Secondary | ICD-10-CM

## 2023-12-12 DIAGNOSIS — I999 Unspecified disorder of circulatory system: Secondary | ICD-10-CM

## 2023-12-12 NOTE — Progress Notes (Signed)
 Patient should see vascular doctor
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
## 2023-12-19 ENCOUNTER — Ambulatory Visit: Attending: Vascular Surgery | Admitting: Vascular Surgery

## 2023-12-19 ENCOUNTER — Encounter: Payer: Self-pay | Admitting: Vascular Surgery

## 2023-12-19 VITALS — BP 142/76 | HR 69 | Temp 98.2°F | Resp 18 | Ht 67.0 in | Wt 200.7 lb

## 2023-12-19 DIAGNOSIS — I739 Peripheral vascular disease, unspecified: Secondary | ICD-10-CM | POA: Diagnosis not present

## 2023-12-22 ENCOUNTER — Other Ambulatory Visit: Payer: Self-pay

## 2023-12-22 DIAGNOSIS — I70213 Atherosclerosis of native arteries of extremities with intermittent claudication, bilateral legs: Secondary | ICD-10-CM

## 2023-12-29 ENCOUNTER — Other Ambulatory Visit (HOSPITAL_COMMUNITY): Payer: Self-pay

## 2023-12-29 ENCOUNTER — Other Ambulatory Visit: Payer: Self-pay | Admitting: Student

## 2023-12-29 ENCOUNTER — Other Ambulatory Visit: Payer: Self-pay | Admitting: *Deleted

## 2023-12-29 DIAGNOSIS — G894 Chronic pain syndrome: Secondary | ICD-10-CM

## 2023-12-29 MED ORDER — GABAPENTIN 300 MG PO CAPS
300.0000 mg | ORAL_CAPSULE | Freq: Every evening | ORAL | 0 refills | Status: DC
  Filled 2023-12-29: qty 30, 30d supply, fill #0

## 2024-01-14 ENCOUNTER — Ambulatory Visit (HOSPITAL_COMMUNITY)
Admission: RE | Admit: 2024-01-14 | Discharge: 2024-01-14 | Disposition: A | Discharge status: Home / Self Care | Payer: Self-pay | Source: Ambulatory Visit | Attending: Internal Medicine | Admitting: Internal Medicine

## 2024-01-14 DIAGNOSIS — I70213 Atherosclerosis of native arteries of extremities with intermittent claudication, bilateral legs: Secondary | ICD-10-CM | POA: Diagnosis present

## 2024-01-14 DIAGNOSIS — E119 Type 2 diabetes mellitus without complications: Secondary | ICD-10-CM | POA: Insufficient documentation

## 2024-01-14 LAB — POCT I-STAT CREATININE: Creatinine, Ser: 1.3 mg/dL — ABNORMAL HIGH (ref 0.61–1.24)

## 2024-01-14 MED ORDER — IOHEXOL 350 MG/ML SOLN
100.0000 mL | Freq: Once | INTRAVENOUS | Status: AC | PRN
  Administered 2024-01-14: 100 mL via INTRAVENOUS

## 2024-01-19 ENCOUNTER — Ambulatory Visit: Payer: Self-pay

## 2024-01-19 ENCOUNTER — Other Ambulatory Visit (HOSPITAL_COMMUNITY): Payer: Self-pay

## 2024-01-19 ENCOUNTER — Other Ambulatory Visit: Payer: Self-pay | Admitting: Student

## 2024-01-19 ENCOUNTER — Encounter: Payer: Self-pay | Admitting: Student

## 2024-01-19 ENCOUNTER — Ambulatory Visit: Admitting: Neurology

## 2024-01-19 ENCOUNTER — Other Ambulatory Visit: Payer: Self-pay | Admitting: Physician Assistant

## 2024-01-19 ENCOUNTER — Other Ambulatory Visit: Payer: Self-pay | Admitting: Cardiology

## 2024-01-19 ENCOUNTER — Encounter: Payer: Self-pay | Admitting: Neurology

## 2024-01-19 VITALS — BP 156/86 | HR 79 | Ht 67.0 in | Wt 190.0 lb

## 2024-01-19 VITALS — BP 119/85 | HR 71 | Temp 97.9°F | Ht 67.0 in | Wt 190.6 lb

## 2024-01-19 DIAGNOSIS — E1165 Type 2 diabetes mellitus with hyperglycemia: Secondary | ICD-10-CM

## 2024-01-19 DIAGNOSIS — R0681 Apnea, not elsewhere classified: Secondary | ICD-10-CM | POA: Diagnosis not present

## 2024-01-19 DIAGNOSIS — R030 Elevated blood-pressure reading, without diagnosis of hypertension: Secondary | ICD-10-CM | POA: Diagnosis not present

## 2024-01-19 DIAGNOSIS — G894 Chronic pain syndrome: Secondary | ICD-10-CM | POA: Diagnosis not present

## 2024-01-19 DIAGNOSIS — I5022 Chronic systolic (congestive) heart failure: Secondary | ICD-10-CM

## 2024-01-19 DIAGNOSIS — I252 Old myocardial infarction: Secondary | ICD-10-CM

## 2024-01-19 DIAGNOSIS — R0683 Snoring: Secondary | ICD-10-CM | POA: Diagnosis not present

## 2024-01-19 DIAGNOSIS — G4719 Other hypersomnia: Secondary | ICD-10-CM

## 2024-01-19 DIAGNOSIS — Z9189 Other specified personal risk factors, not elsewhere classified: Secondary | ICD-10-CM

## 2024-01-19 DIAGNOSIS — E663 Overweight: Secondary | ICD-10-CM | POA: Diagnosis not present

## 2024-01-19 DIAGNOSIS — Z8679 Personal history of other diseases of the circulatory system: Secondary | ICD-10-CM | POA: Diagnosis not present

## 2024-01-19 DIAGNOSIS — R351 Nocturia: Secondary | ICD-10-CM | POA: Diagnosis not present

## 2024-01-19 DIAGNOSIS — Z7984 Long term (current) use of oral hypoglycemic drugs: Secondary | ICD-10-CM | POA: Diagnosis not present

## 2024-01-19 LAB — GLUCOSE, CAPILLARY: Glucose-Capillary: 424 mg/dL — ABNORMAL HIGH (ref 70–99)

## 2024-01-19 LAB — POCT GLYCOSYLATED HEMOGLOBIN (HGB A1C): HbA1c, POC (controlled diabetic range): 10.1 % — AB (ref 0.0–7.0)

## 2024-01-19 MED ORDER — GLUCOSE BLOOD VI STRP
ORAL_STRIP | 3 refills | Status: AC
  Filled 2024-01-19: qty 50, 50d supply, fill #0

## 2024-01-19 MED ORDER — METFORMIN HCL 500 MG PO TABS
ORAL_TABLET | ORAL | 0 refills | Status: AC
  Filled 2024-01-19: qty 90, 90d supply, fill #0

## 2024-01-19 MED ORDER — GABAPENTIN 300 MG PO CAPS
300.0000 mg | ORAL_CAPSULE | Freq: Every evening | ORAL | 0 refills | Status: DC
  Filled 2024-01-19: qty 30, 30d supply, fill #0
  Filled ????-??-??: fill #0

## 2024-01-19 MED ORDER — IRBESARTAN 75 MG PO TABS
37.5000 mg | ORAL_TABLET | Freq: Every day | ORAL | 2 refills | Status: AC
  Filled 2024-01-19: qty 45, 90d supply, fill #0

## 2024-01-19 MED ORDER — GABAPENTIN 300 MG PO CAPS
300.0000 mg | ORAL_CAPSULE | Freq: Every evening | ORAL | 0 refills | Status: AC
  Filled 2024-01-19 – 2024-01-30 (×2): qty 30, 30d supply, fill #0

## 2024-01-19 MED ORDER — BLOOD GLUCOSE MONITOR SYSTEM W/DEVICE KIT
PACK | 0 refills | Status: AC
  Filled 2024-01-19: qty 1, 1d supply, fill #0

## 2024-01-19 MED ORDER — BLOOD GLUCOSE MONITOR SYSTEM W/DEVICE KIT
PACK | 0 refills | Status: DC
  Filled 2024-01-19: qty 1, 30d supply, fill #0

## 2024-01-19 MED ORDER — METFORMIN HCL 500 MG PO TABS
500.0000 mg | ORAL_TABLET | Freq: Every day | ORAL | 0 refills | Status: DC
  Filled 2024-01-19: qty 90, 23d supply, fill #0
  Filled 2024-01-19: qty 70, 30d supply, fill #0

## 2024-01-19 MED ORDER — ACCU-CHEK SOFTCLIX LANCETS MISC
12 refills | Status: AC
  Filled 2024-01-19: qty 100, 90d supply, fill #0

## 2024-01-19 MED ORDER — CLOPIDOGREL BISULFATE 75 MG PO TABS
75.0000 mg | ORAL_TABLET | Freq: Every day | ORAL | 2 refills | Status: AC
  Filled 2024-01-19: qty 90, 90d supply, fill #0

## 2024-01-19 NOTE — Patient Instructions (Signed)

## 2024-01-19 NOTE — Patient Instructions (Signed)
 It was wonderful seeing you today!   Please remember to...  1) Pick up metformin  from pharmacy  -first week: take one tablet (500 mg) in the morning with food  -second week: take one tablet in morning and one tablet in evening   -third week: take two tablets (1000 mg) in the morning and one tablet (500 mg) in the evening  -fourth week: take two tablets in the morning and two tablets in the evening   Please limit your sugar intake including sweets, juice, sodas, bread, pastas, gatorade.  If you have any questions please feel free to the call the clinic at anytime at [phone].  Have a blessed day,  Dr. Charmayne

## 2024-01-19 NOTE — Progress Notes (Signed)
 "  Established Patient Office Visit  Subjective   Patient ID: Wayne Huff, male    DOB: 1970-10-07  Age: 54 y.o. MRN: [id number]  Diabetes   Last office visit in November.  Patient expresses he is thirsty and urinating frequently.  He has not had any improvement in his sleep.  He has an appointment with the sleep doctors today. Patient Active Problem List   Diagnosis Date Noted   Primary hypertension 09/29/2023   Diabetes mellitus type II, non insulin dependent (HCC) 09/29/2023   Coronary artery disease involving native coronary artery without angina pectoris 09/11/2023   Presence of drug coated stent in right coronary artery 09/11/2023   GERD (gastroesophageal reflux disease) 09/11/2023   Fatigue due to treatment 09/11/2023   Hyperlipidemia with target low density lipoprotein (LDL) cholesterol less than 55 mg/dL 88/80/7975   History of ischemic cardiomyopathy 11/26/2022   ST elevation myocardial infarction (STEMI) of inferior wall (HCC) 11/23/2022   DENTAL PAIN 05/29/2006        Objective:     BP 119/85 (BP Location: Right Arm, Patient Position: Sitting, Cuff Size: Small)   Pulse 71   Temp 97.9 F (36.6 C) (Oral)   Ht 5' 7 (1.702 m)   Wt 190 lb 9.6 oz (86.5 kg)   SpO2 99%   BMI 29.85 kg/m  BP Readings from Last 3 Encounters:  01/19/24 (!) 156/86  01/19/24 119/85  12/19/23 (!) 142/76   Wt Readings from Last 3 Encounters:  01/19/24 190 lb (86.2 kg)  01/19/24 190 lb 9.6 oz (86.5 kg)  12/19/23 200 lb 11.2 oz (91 kg)      Physical Exam Vitals reviewed.  Constitutional:      Appearance: Normal appearance.  HENT:     Nose: Nose normal.     Mouth/Throat:     Mouth: Mucous membranes are moist.  Eyes:     Conjunctiva/sclera: Conjunctivae normal.  Neurological:     General: No focal deficit present.     Mental Status: He is alert and oriented to person, place, and time.  Psychiatric:        Mood and Affect: Mood normal.        Behavior: Behavior normal.       Results for orders placed or performed in visit on 01/19/24  Glucose, capillary  Result Value Ref Range   Glucose-Capillary 424 (H) 70 - 99 mg/dL   Comment 1 Notify RN    Comment 2 Document in Chart   POC Hbg A1C  Result Value Ref Range   Hemoglobin A1C     HbA1c POC (<> result, manual entry)     HbA1c, POC (prediabetic range)     HbA1c, POC (controlled diabetic range) 10.1 (A) 0.0 - 7.0 %    Last metabolic panel Lab Results  Component Value Date   GLUCOSE 87 09/11/2023   NA 136 09/11/2023   K 4.7 09/11/2023   CL 98 09/11/2023   CO2 23 09/11/2023   BUN 11 09/11/2023   CREATININE 1.30 (H) 01/14/2024   EGFR 74 09/11/2023   CALCIUM  9.1 09/11/2023   PROT 6.9 09/11/2023   ALBUMIN 4.4 09/11/2023   LABGLOB 2.5 09/11/2023   BILITOT 0.5 09/11/2023   ALKPHOS 89 09/11/2023   AST 29 09/11/2023   ALT 47 (H) 09/11/2023   ANIONGAP 9 11/26/2022   Last hemoglobin A1c Lab Results  Component Value Date   HGBA1C 10.1 (A) 01/19/2024      The ASCVD Risk score (Arnett DK,  et al., 2019) failed to calculate for the following reasons:   Risk score cannot be calculated because patient has a medical history suggesting prior/existing ASCVD   * - Cholesterol units were assumed    Assessment & Plan:   Assessment & Plan Type 2 diabetes mellitus with hyperglycemia, without long-term current use of insulin (HCC) Glucose this morning 424 and A1c 10.1.  A1c is up almost 4 points since September.  He is taking his dapagliflozin  but has been unsuccessful in getting his insurance to pay for the GLP.  Will reach out to Chilon today about trying to again get his insurance to cover this medication.  In the meantime, shared decision making to go ahead and start metformin  today.  Given his symptoms of worsening vision, peripheral neuropathy, polyuria polydipsia, patient understands the importance of urgently treating his elevated sugars.  Will have him start with 500 mg a.m. for the first week, 500  mg twice daily the second week, 1000 mg a.m. and 500 mg p.m. the third week, with the goal of 1000 mg twice daily the fourth week.  Counseled him on adverse effects including diarrhea and encouraged him to take it with food.  Will have patient follow back up in a month to see how he is tolerating metformin  and see if he has been able to get approval for Ozempic .  He is also interested in learning more about a diabetic diet would like to meet with Arland at his next office visit.  Does not appear to have urine microalbumin in his chart, we will update that today.  Had a diabetic foot exam in November.  Will need a diabetic eye exam referral at follow-up. Orders:   POC Hbg A1C   Microalbumin / Creatinine Urine Ratio   Lipid Profile   metFORMIN  (GLUCOPHAGE ) 500 MG tablet; Week 1: Take one tablet (500 mg) in the morning with food; Week 2: Take one tablet in morning and one tablet in evening; Week 3: Take two tablets (1000 mg) in the morning and one tablet (500 mg) in the evening: Week 4: Take two tablets in the morning and two tablets in the evening.   Referral to Nutrition and Diabetes Services   Blood Glucose Monitoring Suppl (BLOOD GLUCOSE MONITOR SYSTEM) w/Device KIT; Use to check blood sugar levels as directed   Basic metabolic panel with GFR  Chronic pain syndrome He has been taking the gabapentin  nightly and is not sure if it is helping with his back pain.  He is being seen by Dr. Pearline for peripheral arterial disease.  Will refill his gabapentin  today. Orders:   gabapentin  (NEURONTIN ) 300 MG capsule; Take 1 capsule (300 mg total) by mouth at bedtime.   Return in about 4 weeks (around 02/16/2024) for for two appts 1) with Dr and 2) with donna for diabetes education .    Viktoria King, DO "
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
## 2024-01-20 ENCOUNTER — Ambulatory Visit: Payer: Self-pay

## 2024-01-20 ENCOUNTER — Other Ambulatory Visit (HOSPITAL_COMMUNITY): Payer: Self-pay

## 2024-01-20 LAB — LIPID PANEL
Chol/HDL Ratio: 3 ratio (ref 0.0–5.0)
Cholesterol, Total: 118 mg/dL (ref 100–199)
HDL: 40 mg/dL
LDL Chol Calc (NIH): 47 mg/dL (ref 0–99)
Triglycerides: 193 mg/dL — ABNORMAL HIGH (ref 0–149)
VLDL Cholesterol Cal: 31 mg/dL (ref 5–40)

## 2024-01-20 LAB — BASIC METABOLIC PANEL WITH GFR
BUN/Creatinine Ratio: 11 (ref 9–20)
BUN: 11 mg/dL (ref 6–24)
CO2: 24 mmol/L (ref 20–29)
Calcium: 9.3 mg/dL (ref 8.7–10.2)
Chloride: 94 mmol/L — ABNORMAL LOW (ref 96–106)
Creatinine, Ser: 1.03 mg/dL (ref 0.76–1.27)
Glucose: 320 mg/dL — ABNORMAL HIGH (ref 70–99)
Potassium: 4.3 mmol/L (ref 3.5–5.2)
Sodium: 136 mmol/L (ref 134–144)
eGFR: 87 mL/min/1.73

## 2024-01-20 LAB — MICROALBUMIN / CREATININE URINE RATIO
Creatinine, Urine: 51.5 mg/dL
Microalb/Creat Ratio: 19 mg/g{creat} (ref 0–29)
Microalbumin, Urine: 10 ug/mL

## 2024-01-20 NOTE — Progress Notes (Signed)
 Internal Medicine Clinic Attending  Case discussed with the resident at the time of the visit.  We reviewed the resident's history and exam and pertinent patient test results.  I agree with the assessment, diagnosis, and plan of care documented in the resident's note.

## 2024-01-20 NOTE — Addendum Note (Signed)
 Addended by: Asako Saliba L on: 01/20/2024 09:21 AM   Modules accepted: Level of Service
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
## 2024-01-23 ENCOUNTER — Encounter: Payer: Self-pay | Admitting: Vascular Surgery

## 2024-01-23 ENCOUNTER — Ambulatory Visit: Payer: Self-pay | Attending: Vascular Surgery | Admitting: Vascular Surgery

## 2024-01-23 VITALS — BP 114/84 | HR 57 | Temp 97.9°F | Resp 18 | Ht 67.0 in | Wt 190.0 lb

## 2024-01-23 DIAGNOSIS — I70222 Atherosclerosis of native arteries of extremities with rest pain, left leg: Secondary | ICD-10-CM | POA: Diagnosis present

## 2024-01-23 DIAGNOSIS — I70221 Atherosclerosis of native arteries of extremities with rest pain, right leg: Secondary | ICD-10-CM | POA: Insufficient documentation

## 2024-01-23 DIAGNOSIS — I739 Peripheral vascular disease, unspecified: Secondary | ICD-10-CM | POA: Diagnosis present

## 2024-01-27 ENCOUNTER — Telehealth: Payer: Self-pay | Admitting: Neurology

## 2024-01-27 NOTE — Telephone Encounter (Signed)
 NPSG [facility] pending.

## 2024-01-29 ENCOUNTER — Other Ambulatory Visit (HOSPITAL_COMMUNITY): Payer: Self-pay
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
## 2024-01-30 ENCOUNTER — Other Ambulatory Visit (HOSPITAL_COMMUNITY): Payer: Self-pay

## 2024-01-30 ENCOUNTER — Ambulatory Visit: Attending: Vascular Surgery | Admitting: Pharmacist

## 2024-01-30 VITALS — BP 132/77 | HR 66

## 2024-01-30 DIAGNOSIS — I70213 Atherosclerosis of native arteries of extremities with intermittent claudication, bilateral legs: Secondary | ICD-10-CM | POA: Diagnosis present

## 2024-01-30 NOTE — Patient Instructions (Addendum)
-   I will work on seeing if we can get your insurance to cover Ozempic . This is the once a week injection that will help to lower your blood sugar, help you lose weight, and lower you cardiovascular risk. I will call you when I have an udpate so please be on the look out for a phone call. - Work on limiting your intake of sugary drinks. Try switching to gatorade zero instead of regular gatorade. - You need to reach out to your cardiologist (Dr. Anner) for clearance for the surgery with Dr. Pearline - Start taking metformin : Week 1: Take one tablet (500 mg) by mouth in the morning with food; Week 2: Take one tablet in morning and one tablet in evening;  Week 3: Take two tablets (1000 mg) in the morning and one tablet (500 mg) in the evening Week 4: Take two tablets in the morning and two tablets in the evening.

## 2024-02-02 ENCOUNTER — Other Ambulatory Visit (HOSPITAL_COMMUNITY): Payer: Self-pay

## 2024-02-02 ENCOUNTER — Telehealth: Payer: Self-pay | Admitting: Pharmacy Technician

## 2024-02-02 NOTE — Telephone Encounter (Signed)
 Pharmacy Patient Advocate Encounter   Received notification from Cc charts- katie s  that prior authorization for ozempic  is required/requested.   Insurance verification completed.   The patient is insured through [facility] MEDICAID.   Per test claim: PA required; PA submitted to above mentioned insurance via Latent Key/confirmation #/EOC [id number] Status is pending

## 2024-02-02 NOTE — Telephone Encounter (Signed)
 Pharmacy Patient Advocate Encounter  Received notification from [facility] MEDICAID that Prior Authorization for ozempic  has been APPROVED from 02/02/24 to 02/01/25. Ran test claim, Copay is $4.00- one month. This test claim was processed through [facility]- copay amounts may vary at other pharmacies due to pharmacy/plan contracts, or as the patient moves through the different stages of their insurance plan.   PA #/Case ID/Reference #: [id number]

## 2024-02-03 ENCOUNTER — Telehealth: Payer: Self-pay | Admitting: Pharmacist

## 2024-02-03 ENCOUNTER — Other Ambulatory Visit (HOSPITAL_COMMUNITY): Payer: Self-pay

## 2024-02-03 MED ORDER — OZEMPIC (0.25 OR 0.5 MG/DOSE) 2 MG/3ML ~~LOC~~ SOPN
0.2500 mg | PEN_INJECTOR | SUBCUTANEOUS | 0 refills | Status: AC
  Filled 2024-02-03: qty 3, 56d supply, fill #0

## 2024-02-03 NOTE — Telephone Encounter (Signed)
 Called patient and notified him that prior authorization for Ozempic  has been approved and prescription is processing for $4/month on his insurance. New prescription sent to [facility] and he plans to pick up today. Counseled on Ozempic  and he expressed understanding. He did start taking metformin  after his appointment last week. Counseled him to continue taking metformin  500 mg daily with starting Ozempic . PharmD Clinic follow up scheduled in ~4 weeks.

## 2024-02-03 NOTE — Telephone Encounter (Signed)
 NPSG 02/03/24 MCD [employer] Community shara: [id number] (exp. 01/27/24 to 04/26/24)

## 2024-02-06 ENCOUNTER — Telehealth: Payer: Self-pay | Admitting: *Deleted

## 2024-02-06 ENCOUNTER — Telehealth (HOSPITAL_BASED_OUTPATIENT_CLINIC_OR_DEPARTMENT_OTHER): Payer: Self-pay | Admitting: *Deleted

## 2024-02-06 NOTE — Telephone Encounter (Signed)
"  °  Patient Consent for Virtual Visit        Wayne Huff has provided verbal consent on 02/06/2024 for a virtual visit (video or telephone).   CONSENT FOR VIRTUAL VISIT FOR:  [facility]  By participating in this virtual visit I agree to the following:  I hereby voluntarily request, consent and authorize [facility] and its employed or contracted physicians, physician assistants, nurse practitioners or other licensed health care professionals (the Practitioner), to provide me with telemedicine health care services (the Services) as deemed necessary by the treating Practitioner. I acknowledge and consent to receive the Services by the Practitioner via telemedicine. I understand that the telemedicine visit will involve communicating with the Practitioner through live audiovisual communication technology and the disclosure of certain medical information by electronic transmission. I acknowledge that I have been given the opportunity to request an in-person assessment or other available alternative prior to the telemedicine visit and am voluntarily participating in the telemedicine visit.  I understand that I have the right to withhold or withdraw my consent to the use of telemedicine in the course of my care at any time, without affecting my right to future care or treatment, and that the Practitioner or I may terminate the telemedicine visit at any time. I understand that I have the right to inspect all information obtained and/or recorded in the course of the telemedicine visit and may receive copies of available information for a reasonable fee.  I understand that some of the potential risks of receiving the Services via telemedicine include:  Delay or interruption in medical evaluation due to technological equipment failure or disruption; Information transmitted may not be sufficient (e.g. poor resolution of images) to allow for appropriate medical decision making by the Practitioner;  and/or  In rare instances, security protocols could fail, causing a breach of personal health information.  Furthermore, I acknowledge that it is my responsibility to provide information about my medical history, conditions and care that is complete and accurate to the best of my ability. I acknowledge that Practitioner's advice, recommendations, and/or decision may be based on factors not within their control, such as incomplete or inaccurate data provided by me or distortions of diagnostic images or specimens that may result from electronic transmissions. I understand that the practice of medicine is not an exact science and that Practitioner makes no warranties or guarantees regarding treatment outcomes. I acknowledge that a copy of this consent can be made available to me via my patient portal [facility] MyChart), or I can request a printed copy by calling the office of [facility].    I understand that my insurance will be billed for this visit.   I have read or had this consent read to me. I understand the contents of this consent, which adequately explains the benefits and risks of the Services being provided via telemedicine.  I have been provided ample opportunity to ask questions regarding this consent and the Services and have had my questions answered to my satisfaction. I give my informed consent for the services to be provided through the use of telemedicine in my medical care    "

## 2024-02-06 NOTE — Telephone Encounter (Signed)
 Spoke with pt and scheduled him for a preop telehealth clearance appt. Will route back to requesting surgeons office to make them aware.

## 2024-02-06 NOTE — Telephone Encounter (Signed)
"  ° °  Pre-operative Risk Assessment    Patient Name: Wayne Huff  DOB: 25-Oct-1970 MRN: [id number]   Date of last office visit: 09/29/23 DR. HARDING Date of next office visit: NONE   Request for Surgical Clearance    Procedure:  AORTOBIFEMORAL BYPASS  Date of Surgery:  Clearance TBD (ASAP) PER FORM                                Surgeon:  DR. PEARLINE Surgeon's Group or Practice Name:  [facility] Phone number:  [phone] Fax number:  [phone]   Type of Clearance Requested:   - Medical , NO INDICATED ON FORM TO BE HELD   Type of Anesthesia:  General    Additional requests/questions:    Signed, Jomayra Novitsky   02/06/2024, 4:03 PM   "

## 2024-02-06 NOTE — Telephone Encounter (Signed)
" ° °  Name: Wayne Huff  DOB: 1970/05/12  MRN: [id number]  Primary Cardiologist: Alm Clay, MD   Preoperative team, please contact this patient and set up a phone call appointment for further preoperative risk assessment. Please obtain consent and complete medication review. Thank you for your help.  I confirm that guidance regarding antiplatelet and oral anticoagulation therapy has been completed and, if necessary, noted below.  Per Dr. Genice note: Okay to hold Plavix  5 to 7 days preop for surgeries or procedures starting December 08, 2023   I also confirmed the patient resides in the state of [place] . As per [facility] Medical Board telemedicine laws, the patient must reside in the state in which the provider is licensed.  Miriam Shams, FNP-C  02/06/2024, 4:28 PM [facility] [address] Office [phone] Fax [phone]    "

## 2024-02-10 ENCOUNTER — Other Ambulatory Visit (HOSPITAL_COMMUNITY): Payer: Self-pay

## 2024-02-10 MED ORDER — TRAMADOL HCL 50 MG PO TABS
50.0000 mg | ORAL_TABLET | Freq: Four times a day (QID) | ORAL | 0 refills | Status: AC | PRN
  Filled 2024-02-10: qty 12, 3d supply, fill #0

## 2024-02-11 NOTE — Telephone Encounter (Signed)
 I spoke with the patient.  NPSG MCD [facility] Community shara: [id number] (exp. 01/27/24 to 04/26/24)  He is scheduled at [facility] for 02/29/24 at 8mp.  Mailed packet and sent mychart

## 2024-02-13 ENCOUNTER — Ambulatory Visit

## 2024-02-13 DIAGNOSIS — Z01810 Encounter for preprocedural cardiovascular examination: Secondary | ICD-10-CM
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
## 2024-02-16 ENCOUNTER — Ambulatory Visit: Payer: Self-pay | Admitting: Student

## 2024-02-16 ENCOUNTER — Ambulatory Visit: Payer: Self-pay | Admitting: Dietician

## 2024-02-27 ENCOUNTER — Ambulatory Visit: Admitting: Pharmacist

## 2024-02-29 ENCOUNTER — Encounter
